# Patient Record
Sex: Male | Born: 1970 | Race: White | Hispanic: No | Marital: Single | Smoking: Former smoker
Health system: Southern US, Community
[De-identification: ages and names within clinical notes are randomized; demographics above are authoritative.]

## PROBLEM LIST (undated history)

## (undated) DIAGNOSIS — L570 Actinic keratosis: Secondary | ICD-10-CM

## (undated) DIAGNOSIS — C4490 Unspecified malignant neoplasm of skin, unspecified: Secondary | ICD-10-CM

## (undated) HISTORY — DX: Unspecified malignant neoplasm of skin, unspecified: C44.90

## (undated) HISTORY — DX: Actinic keratosis: L57.0

## (undated) HISTORY — PX: MOHS SURGERY: SHX181

---

## 1998-04-17 HISTORY — PX: CHOLECYSTECTOMY: SHX55

## 2007-04-18 HISTORY — PX: CHOLECYSTECTOMY: SHX55

## 2012-09-19 DIAGNOSIS — C4491 Basal cell carcinoma of skin, unspecified: Secondary | ICD-10-CM

## 2012-09-19 HISTORY — DX: Basal cell carcinoma of skin, unspecified: C44.91

## 2013-03-21 DIAGNOSIS — C4492 Squamous cell carcinoma of skin, unspecified: Secondary | ICD-10-CM

## 2013-03-21 HISTORY — DX: Squamous cell carcinoma of skin, unspecified: C44.92

## 2013-11-08 ENCOUNTER — Emergency Department: Payer: Self-pay | Admitting: Emergency Medicine

## 2013-12-10 ENCOUNTER — Ambulatory Visit (INDEPENDENT_AMBULATORY_CARE_PROVIDER_SITE_OTHER): Payer: BC Managed Care – PPO | Admitting: Family Medicine

## 2013-12-10 ENCOUNTER — Encounter: Payer: Self-pay | Admitting: Family Medicine

## 2013-12-10 ENCOUNTER — Other Ambulatory Visit: Payer: Self-pay | Admitting: Family Medicine

## 2013-12-10 VITALS — BP 120/82 | HR 65 | Temp 98.0°F | Resp 16 | Ht 72.0 in | Wt 212.0 lb

## 2013-12-10 DIAGNOSIS — S39012A Strain of muscle, fascia and tendon of lower back, initial encounter: Secondary | ICD-10-CM | POA: Insufficient documentation

## 2013-12-10 DIAGNOSIS — S335XXA Sprain of ligaments of lumbar spine, initial encounter: Secondary | ICD-10-CM

## 2013-12-10 DIAGNOSIS — N5201 Erectile dysfunction due to arterial insufficiency: Secondary | ICD-10-CM

## 2013-12-10 DIAGNOSIS — N529 Male erectile dysfunction, unspecified: Secondary | ICD-10-CM

## 2013-12-10 MED ORDER — SILDENAFIL CITRATE 100 MG PO TABS: 100.0000 mg | ORAL_TABLET | Freq: Every day | ORAL | Status: DC | PRN

## 2013-12-10 MED ORDER — CYCLOBENZAPRINE HCL 10 MG PO TABS: 10.0000 mg | ORAL_TABLET | Freq: Three times a day (TID) | ORAL | Status: DC | PRN

## 2013-12-10 NOTE — Assessment & Plan Note (Signed)
Myofascial LB strain, acute. Discussed need to do heat bid, alleve 440 bid x 12---with food, samples given. Flexeril 10mg  tid prn muscle spasm, #30, no RF.   Therapeutic expectations and side effect profile of medication discussed today.  Patient's questions answered. Reviewed handout of back stretching/strengthening exercises to do over the next few weeks.   Encouraged him to start regular CV exercise, attempt to lose some wt, as this will help in the long run with his back.

## 2013-12-10 NOTE — Progress Notes (Signed)
Office Note 12/10/2013  CC:  Chief Complaint  Patient presents with  . Establish Care  . Back Pain    x 1 week    HPI:  Wayne Sanford is a 43 y.o. White male who is here to establish care and discuss back pain. Patient's most recent primary MD: none Old records were not reviewed prior to or during today's visit.  Last Tdap was 2 wks ago when he got a fishhook in finger and went to ED.  Complains of constant lumbar back pain x 2 wks, occ sharp pain with certain movements.  No injury or overuse/lifting problem prior.  Intensity 3/10 constant, breakthrough pains 9/10.  NO radiation of pain, no paresthesias, no LE weakness. Massage helps but the muscles are tender.  Hasn't had this problem before. He doesn't get any exercise except walking as part of his job. No significant increase in weight lately associated with the onset of his pain.  No saddle anesthesia.  No loss of bowel or bladder control.  Has tried 3 advil on a few nights before bed but it doesn't help much.    Also c/o erectile dysfunction for the last 3-5 years, has never sought medical help.  Gets erection about 40% of "normal" firmness, can perform vaginal penetration but often cannot finish intercourse.  Says libido is normal.  Denies psychological problems that may be playing a role.  Has had same girlfriend for 2 yrs now.  Has never tried an ED med. He is a smoker, doesn't exercise, and is moderately overweight.    He says he some screening labs via his employer a few weeks ago and was told all was normal except his HDL was a "little low".  Past Medical History  Diagnosis Date  . Nonmelanoma skin cancer     Basal and squamous cell--multiple ([facility]--f/u q24mo)    Past Surgical History  Procedure Laterality Date  . Cholecystectomy  2000  . Mohs surgery      Family History  Problem Relation Age of Onset  . Adopted: Yes    History   Social History  . Marital Status: Single    Spouse  Name: N/A    Number of Children: N/A  . Years of Education: N/A   Occupational History  . Not on file.   Social History Main Topics  . Smoking status: Current Every Day Smoker -- 1.00 packs/day for 20 years    Types: Cigarettes  . Smokeless tobacco: Never Used  . Alcohol Use: No  . Drug Use: No  . Sexual Activity: Not on file   Other Topics Concern  . Not on file   Social History Narrative   Divorced, 3 daughters (10y, 12y, 21y).   Orig from [facility], relocated to [place] 2011.   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   Tob: 20 pack-yr hx --current as of 11/2013.   No alcohol  No drugs.   Enjoys golf, boating.    Outpatient Encounter Prescriptions as of 12/10/2013  Medication Sig  . cyclobenzaprine (FLEXERIL) 10 MG tablet Take 1 tablet (10 mg total) by mouth 3 (three) times daily as needed for muscle spasms.  . sildenafil (VIAGRA) 100 MG tablet Take 1 tablet (100 mg total) by mouth daily as needed for erectile dysfunction.  . [DISCONTINUED] sildenafil (VIAGRA) 100 MG tablet Take 1 tablet (100 mg total) by mouth daily as needed for erectile dysfunction.    No Known Allergies  ROS Review of Systems  Constitutional: Negative for fever and fatigue.  HENT: Negative for congestion and sore throat.   Eyes: Negative for visual disturbance.  Respiratory: Negative for cough.   Cardiovascular: Negative for chest pain.  Gastrointestinal: Negative for nausea and abdominal pain.  Genitourinary: Negative for dysuria.  Musculoskeletal: Negative for arthralgias, joint swelling and neck pain.  Skin: Negative for rash.  Neurological: Negative for weakness and headaches.  Hematological: Negative for adenopathy.  Psychiatric/Behavioral: Negative for dysphoric mood.    PE; Blood pressure 120/82, pulse 65, temperature 98 F (36.7 C), temperature source Temporal, resp. rate 16, height 6' (1.829 m), weight 212 lb (96.163 kg), SpO2 99.00%. BMI is 29 Gen: Alert, well appearing,  slightly overweight-appearing.  Patient is oriented to person, place, time, and situation. AFFECT: pleasant, lucid thought and speech. IZT:IWPY: no injection, icteris, swelling, or exudate.  EOMI, PERRLA. Mouth: lips without lesion/swelling.  Oral mucosa pink and moist. Oropharynx without erythema, exudate, or swelling.  CV: RRR, no m/r/g.   LUNGS: CTA bilat, nonlabored resps, good aeration in all lung fields. ABD: soft, NT/ND, no HSM or mass. EXT: no clubbing, cyanosis, or edema.  BACK: Rom fully intact, with mild pain in diffuse lumbar region with extension.  No tenderness of back, no deformity, no rash.  He has negative supine SLR bilat.  Strength 5/5 prox/dist in LE's bilat.  DTRs 1+ in patellar and achilles areas bilat.    Pertinent labs:  None today  ASSESSMENT AND PLAN:   New pt; no old records to obtain.  Low back strain Myofascial LB strain, acute. Discussed need to do heat bid, alleve 440 bid x 12---with food, samples given. Flexeril 10mg  tid prn muscle spasm, #30, no RF.   Therapeutic expectations and side effect profile of medication discussed today.  Patient's questions answered. Reviewed handout of back stretching/strengthening exercises to do over the next few weeks.   Encouraged him to start regular CV exercise, attempt to lose some wt, as this will help in the long run with his back.    Erectile dysfunction Chronic. Emphasized need to quit smoking, exercise, lose some weight. Opted for trial of viagra 50-100 mg qd prn, #9.  Prescriptions savings coupon given to pt today.  Therapeutic expectations and side effect profile of medication discussed today.  Patient's questions answered.   An After Visit Summary was printed and given to the patient.  Return if symptoms worsen or fail to improve.

## 2013-12-10 NOTE — Patient Instructions (Signed)
Take 2 alleve tabs twice a day for 12 days (take with food).  Back Exercises These exercises may help you when beginning to rehabilitate your injury. Your symptoms may resolve with or without further involvement from your physician, physical therapist or athletic trainer. While completing these exercises, remember:   Restoring tissue flexibility helps normal motion to return to the joints. This allows healthier, less painful movement and activity.  An effective stretch should be held for at least 30 seconds.  A stretch should never be painful. You should only feel a gentle lengthening or release in the stretched tissue. STRETCH - Extension, Prone on Elbows   Lie on your stomach on the floor, a bed will be too soft. Place your palms about shoulder width apart and at the height of your head.  Place your elbows under your shoulders. If this is too painful, stack pillows under your chest.  Allow your body to relax so that your hips drop lower and make contact more completely with the floor.  Hold this position for __________ seconds.  Slowly return to lying flat on the floor. Repeat __________ times. Complete this exercise __________ times per day.  RANGE OF MOTION - Extension, Prone Press Ups   Lie on your stomach on the floor, a bed will be too soft. Place your palms about shoulder width apart and at the height of your head.  Keeping your back as relaxed as possible, slowly straighten your elbows while keeping your hips on the floor. You may adjust the placement of your hands to maximize your comfort. As you gain motion, your hands will come more underneath your shoulders.  Hold this position __________ seconds.  Slowly return to lying flat on the floor. Repeat __________ times. Complete this exercise __________ times per day.  RANGE OF MOTION- Quadruped, Neutral Spine   Assume a hands and knees position on a firm surface. Keep your hands under your shoulders and your knees under your  hips. You may place padding under your knees for comfort.  Drop your head and point your tail bone toward the ground below you. This will round out your low back like an angry cat. Hold this position for __________ seconds.  Slowly lift your head and release your tail bone so that your back sags into a large arch, like an old horse.  Hold this position for __________ seconds.  Repeat this until you feel limber in your low back.  Now, find your "sweet spot." This will be the most comfortable position somewhere between the two previous positions. This is your neutral spine. Once you have found this position, tense your stomach muscles to support your low back.  Hold this position for __________ seconds. Repeat __________ times. Complete this exercise __________ times per day.  STRETCH - Flexion, Single Knee to Chest   Lie on a firm bed or floor with both legs extended in front of you.  Keeping one leg in contact with the floor, bring your opposite knee to your chest. Hold your leg in place by either grabbing behind your thigh or at your knee.  Pull until you feel a gentle stretch in your low back. Hold __________ seconds.  Slowly release your grasp and repeat the exercise with the opposite side. Repeat __________ times. Complete this exercise __________ times per day.  STRETCH - Hamstrings, Standing  Stand or sit and extend your right / left leg, placing your foot on a chair or foot stool  Keeping a slight arch in  your low back and your hips straight forward.  Lead with your chest and lean forward at the waist until you feel a gentle stretch in the back of your right / left knee or thigh. (When done correctly, this exercise requires leaning only a small distance.)  Hold this position for __________ seconds. Repeat __________ times. Complete this stretch __________ times per day. STRENGTHENING - Deep Abdominals, Pelvic Tilt   Lie on a firm bed or floor. Keeping your legs in front of  you, bend your knees so they are both pointed toward the ceiling and your feet are flat on the floor.  Tense your lower abdominal muscles to press your low back into the floor. This motion will rotate your pelvis so that your tail bone is scooping upwards rather than pointing at your feet or into the floor.  With a gentle tension and even breathing, hold this position for __________ seconds. Repeat __________ times. Complete this exercise __________ times per day.  STRENGTHENING - Abdominals, Crunches   Lie on a firm bed or floor. Keeping your legs in front of you, bend your knees so they are both pointed toward the ceiling and your feet are flat on the floor. Cross your arms over your chest.  Slightly tip your chin down without bending your neck.  Tense your abdominals and slowly lift your trunk high enough to just clear your shoulder blades. Lifting higher can put excessive stress on the low back and does not further strengthen your abdominal muscles.  Control your return to the starting position. Repeat __________ times. Complete this exercise __________ times per day.  STRENGTHENING - Quadruped, Opposite UE/LE Lift   Assume a hands and knees position on a firm surface. Keep your hands under your shoulders and your knees under your hips. You may place padding under your knees for comfort.  Find your neutral spine and gently tense your abdominal muscles so that you can maintain this position. Your shoulders and hips should form a rectangle that is parallel with the floor and is not twisted.  Keeping your trunk steady, lift your right hand no higher than your shoulder and then your left leg no higher than your hip. Make sure you are not holding your breath. Hold this position __________ seconds.  Continuing to keep your abdominal muscles tense and your back steady, slowly return to your starting position. Repeat with the opposite arm and leg. Repeat __________ times. Complete this exercise  __________ times per day. Document Released: 04/21/2005 Document Revised: 06/26/2011 Document Reviewed: 07/16/2008 [facility] Patient Information 2015 [place], [place]. This information is not intended to replace advice given to you by your health care provider. Make sure you discuss any questions you have with your health care provider.

## 2013-12-10 NOTE — Assessment & Plan Note (Signed)
Chronic. Emphasized need to quit smoking, exercise, lose some weight. Opted for trial of viagra 50-100 mg qd prn, #9.  Prescriptions savings coupon given to pt today.  Therapeutic expectations and side effect profile of medication discussed today.  Patient's questions answered.

## 2014-01-05 ENCOUNTER — Ambulatory Visit: Payer: BC Managed Care – PPO | Admitting: Family Medicine

## 2014-01-05 ENCOUNTER — Emergency Department (HOSPITAL_COMMUNITY): Payer: BC Managed Care – PPO

## 2014-01-05 ENCOUNTER — Emergency Department (HOSPITAL_COMMUNITY)
Admission: EM | Admit: 2014-01-05 | Discharge: 2014-01-05 | Disposition: A | Discharge status: Home / Self Care | Payer: BC Managed Care – PPO | Attending: Emergency Medicine | Admitting: Emergency Medicine

## 2014-01-05 ENCOUNTER — Encounter (HOSPITAL_COMMUNITY): Payer: Self-pay | Admitting: Emergency Medicine

## 2014-01-05 DIAGNOSIS — R42 Dizziness and giddiness: Secondary | ICD-10-CM | POA: Diagnosis not present

## 2014-01-05 DIAGNOSIS — R29810 Facial weakness: Secondary | ICD-10-CM | POA: Diagnosis present

## 2014-01-05 DIAGNOSIS — Z85820 Personal history of malignant melanoma of skin: Secondary | ICD-10-CM | POA: Diagnosis not present

## 2014-01-05 DIAGNOSIS — Z79899 Other long term (current) drug therapy: Secondary | ICD-10-CM | POA: Insufficient documentation

## 2014-01-05 DIAGNOSIS — F17200 Nicotine dependence, unspecified, uncomplicated: Secondary | ICD-10-CM | POA: Insufficient documentation

## 2014-01-05 DIAGNOSIS — R519 Headache, unspecified: Secondary | ICD-10-CM

## 2014-01-05 DIAGNOSIS — R51 Headache: Secondary | ICD-10-CM | POA: Insufficient documentation

## 2014-01-05 LAB — I-STAT CHEM 8, ED
BUN: 14 mg/dL (ref 6–23)
Calcium, Ion: 1.21 mmol/L (ref 1.12–1.23)
Chloride: 106 mEq/L (ref 96–112)
Creatinine, Ser: 1.3 mg/dL (ref 0.50–1.35)
GLUCOSE: 110 mg/dL — AB (ref 70–99)
HEMATOCRIT: 47 % (ref 39.0–52.0)
HEMOGLOBIN: 16 g/dL (ref 13.0–17.0)
Potassium: 3.8 mEq/L (ref 3.7–5.3)
Sodium: 140 mEq/L (ref 137–147)
TCO2: 25 mmol/L (ref 0–100)

## 2014-01-05 NOTE — ED Notes (Signed)
Pt reports dizziness x 1 month with difficulty forming words. Last Friday pt visitor reported to him that the left side of his face was drooping. Today visitor reported the same. LSN last night. Pt c/o right sided headache. Speech clear, tongue midline, equal grips.

## 2014-01-05 NOTE — ED Provider Notes (Signed)
CSN: [id number]     Arrival date & time 01/05/14  3267 History   First MD Initiated Contact with Patient 01/05/14 1006     Chief Complaint  Patient presents with  . Facial Droop  . Dizziness     (Consider location/radiation/quality/duration/timing/severity/associated sxs/prior Treatment) HPI Comments: Wayne Sanford 43 y.o. With a history of smoking and basal cell carcinoma presents to the ED for concern of transient facial droop. This occurred about 10 days ago and again today. His wife reports the left lower half of his face appeared to droop. He does have frequent headaches as well. No headache at this time. No DM, HTN, CVA. No family history as the patient is adopted. He has a chronic history of headaches which have increased in frequency lately due to stress at work reportedly. He has a new boss with "new demands" causing a lot of stress. He has also experienced some insomnia. Denies dysphagia, numbness, tingling, weakness, visual changes, difficulty with his gait. He also reports some intermittent difficulty finding words over the past months as well. Denies fever, chills, sweats.  Patient is a 43 y.o. male presenting with neurologic complaint. The history is provided by the patient and the spouse.  Neurologic Problem This is a new problem. Episode onset: 9 days ago. The problem occurs rarely (occurred once ten days ago (lasted for about an hour), occurred again today (lasted for about 30 minutes)). Associated symptoms include headaches. Pertinent negatives include no abdominal pain, anorexia, arthralgias, change in bowel habit, chest pain, chills, congestion, coughing, diaphoresis, fatigue, fever, joint swelling, myalgias, nausea, neck pain, numbness, rash, sore throat, swollen glands, urinary symptoms, vertigo, visual change, vomiting or weakness. Nothing aggravates the symptoms. He has tried nothing for the symptoms.    Past Medical History  Diagnosis Date  . Nonmelanoma skin cancer      Basal and squamous cell--multiple ([facility]--f/u q41mo)   Past Surgical History  Procedure Laterality Date  . Cholecystectomy  2000  . Mohs surgery     Family History  Problem Relation Age of Onset  . Adopted: Yes   History  Substance Use Topics  . Smoking status: Current Every Day Smoker -- 1.00 packs/day for 20 years    Types: Cigarettes  . Smokeless tobacco: Never Used  . Alcohol Use: No    Review of Systems  Constitutional: Negative for fever, chills, diaphoresis and fatigue.  HENT: Negative for congestion and sore throat.   Respiratory: Negative for cough.   Cardiovascular: Negative for chest pain.  Gastrointestinal: Negative for nausea, vomiting, abdominal pain, anorexia and change in bowel habit.  Musculoskeletal: Negative for arthralgias, joint swelling, myalgias and neck pain.  Skin: Negative for rash.  Neurological: Positive for headaches. Negative for vertigo, weakness and numbness.  All other systems reviewed and are negative.     Allergies  Review of patient's allergies indicates no known allergies.  Home Medications   Prior to Admission medications   Medication Sig Start Date End Date Taking? Authorizing Provider  cyclobenzaprine (FLEXERIL) 10 MG tablet Take 1 tablet (10 mg total) by mouth 3 (three) times daily as needed for muscle spasms. 12/10/13  Yes Tammi Sou, MD  sildenafil (VIAGRA) 100 MG tablet Take 1 tablet (100 mg total) by mouth daily as needed for erectile dysfunction. 12/10/13  Yes Tammi Sou, MD   BP 108/63  Pulse 52  Temp(Src) 97.3 F (36.3 C) (Oral)  Resp 17  Ht 6' (1.829 m)  Wt 215 lb (  97.523 kg)  BMI 29.15 kg/m2  SpO2 98% Physical Exam  Nursing note and vitals reviewed. Constitutional: He is oriented to person, place, and time. He appears well-developed and well-nourished. No distress.  HENT:  Head: Normocephalic and atraumatic.  Eyes: Conjunctivae and EOM are normal. Right eye exhibits no discharge.  Left eye exhibits no discharge.  Neck: Normal range of motion. Neck supple. No tracheal deviation present.  Cardiovascular: Normal rate, regular rhythm and normal heart sounds.  Exam reveals no friction rub.   No murmur heard. Pulmonary/Chest: Effort normal and breath sounds normal. No stridor. No respiratory distress. He has no wheezes. He has no rales. He exhibits no tenderness.  Abdominal: Soft. He exhibits no distension. There is no tenderness. There is no rebound and no guarding.  Neurological: He is alert and oriented to person, place, and time. No cranial nerve deficit.  Strength in flexion and extension at the shoulders, elbows, wrists, hips, knee, and ankle 5/5 bilaterally EHL 5/5 bilaterally Grip strength 5/5 bilaterally Finger to nose, heel toe shin and rapid alternating movements intact bilaterally. No pronator drift bilaterally. Sensation over the dorsum of the hand over the 1st, second and 5th metacarpal, and the lateral forearm and lateral shoulder intact bilaterally.  Sensation over the medial and lateral malleolus, and over the 1st metatarsal intact bilaterally.   Skin: Skin is warm.  Psychiatric: He has a normal mood and affect.    ED Course  Procedures (including critical care time) Labs Review Labs Reviewed  I-STAT CHEM 8, ED - Abnormal; Notable for the following:    Glucose, Bld 110 (*)    All other components within normal limits  CBG MONITORING, ED    Imaging Review Ct Head Wo Contrast  01/05/2014   CLINICAL DATA:  Dizziness.  Facial droop.  EXAM: CT HEAD WITHOUT CONTRAST  TECHNIQUE: Contiguous axial images were obtained from the base of the skull through the vertex without intravenous contrast.  COMPARISON:  None.  FINDINGS: No intra-axial or extra-axial pathologic fluid or blood collections identified. No mass lesion. No hydrocephalus. No hemorrhage. No acute bony abnormality.  IMPRESSION: No acute abnormality.   Electronically Signed   By: Marcello Moores  Register   On:  01/05/2014 11:47     EKG Interpretation   Date/Time:  Monday January 05 2014 09:30:20 EDT Ventricular Rate:  69 PR Interval:  130 QRS Duration: 96 QT Interval:  398 QTC Calculation: 426 R Axis:   87 Text Interpretation:  Normal sinus rhythm Incomplete right bundle branch  block Rightward axis Nonspecific T wave abnormality No previous tracing  Confirmed by Ashok Cordia  MD, Lennette Bihari ([id number]) on 01/05/2014 10:35:48 AM      MDM   Final diagnoses:  Facial droop  Acute nonintractable headache, unspecified headache type    Pt presents with transient facial droop and chronic headaches. AFVSS. Only risk factor for TIA include smoking. AFVSS here. VS wnls. CN intact. Motor function, sensory function, and cerebellar function intact. I have a low suspicion this is related to an acute intracranial event. CT head NAICA. ABCD2 score is 1 (age <60, BP <140/90, No unilateral weakness, no speech, and symptoms <1 hour) disturbance - low risk for stroke. Safe to dc home and follow up with his primary care doctor for further work up of possible TIA. Also, gave information to establish care and be evaluated by a neurologist for his chronic headaches. He and his wife were in agreement with the treatment plan. Gave strong return precautions including worsening symptoms or  any other alarming or concerning symptoms or issues. Strong return precautions given for worsening symptoms or any other alarming or concerning symptoms or issues. The patient was in agreement with the treatment plan and I answered all of their questions. The patient was stable for dc. At dc, the patient ambulated without difficulty, was moving all four extremities, symptoms improved, NAD. and AOx4 Care discussed with my attending, Dr. Ashok Cordia. If performed and available, imaging studies and labs reviewed.     Kelby Aline, MD 01/05/14 [phone]

## 2014-01-05 NOTE — ED Notes (Signed)
POC CBG resulted 91

## 2014-01-06 ENCOUNTER — Ambulatory Visit (INDEPENDENT_AMBULATORY_CARE_PROVIDER_SITE_OTHER): Payer: BC Managed Care – PPO | Admitting: Family Medicine

## 2014-01-06 ENCOUNTER — Encounter: Payer: Self-pay | Admitting: Family Medicine

## 2014-01-06 VITALS — BP 118/75 | HR 65 | Temp 98.2°F | Resp 16 | Ht 72.0 in | Wt 210.0 lb

## 2014-01-06 DIAGNOSIS — F329 Major depressive disorder, single episode, unspecified: Secondary | ICD-10-CM | POA: Insufficient documentation

## 2014-01-06 DIAGNOSIS — F419 Anxiety disorder, unspecified: Secondary | ICD-10-CM

## 2014-01-06 DIAGNOSIS — F32A Depression, unspecified: Secondary | ICD-10-CM

## 2014-01-06 DIAGNOSIS — R51 Headache: Secondary | ICD-10-CM

## 2014-01-06 DIAGNOSIS — G8929 Other chronic pain: Secondary | ICD-10-CM

## 2014-01-06 DIAGNOSIS — F341 Dysthymic disorder: Secondary | ICD-10-CM

## 2014-01-06 DIAGNOSIS — R29818 Other symptoms and signs involving the nervous system: Secondary | ICD-10-CM

## 2014-01-06 NOTE — Assessment & Plan Note (Signed)
This could explain all of his symptoms except the facial droop. I will hold off on any meds at this time in order to avoid causing any confusion regarding possible med side effects/symptoms, etc. Will pick back up with this after he sees the neurologist.

## 2014-01-06 NOTE — Progress Notes (Signed)
OFFICE VISIT  01/06/2014   CC:  Chief Complaint  Patient presents with  . Hospitalization Follow-up   HPI:    Patient is a 43 y.o. Caucasian male who presents for ED f/u-was seen yesterday for hx of facial droop that occurred on 2 occasions.  I reviewed the ED record completely today, exam was normal, lytes/cr normal, CT head normal, EKG normal, VS normal.  He was reassured and told to f/u here.    Pt reports two distinct occasions of noticing his left side of face around the mouth being droopy compared to the right, one time was 10d ago and the other was yesterday, each time it lasted only 20-30 min and was not associated with any other complaint. He does say he has had a chronic mild headache for about the last 6 wks, involves entire head and is not throbbing or anything that prompts him to take any abortive med.  He denies photo/phonophobia, denies nausea.  He has no hx of migraine HA's. No unilateral periorbital HA's or tearing noted.  No hearing complaints.  He has some intermittent, brief periods in which he feels like his vision is blurry, like he has to stand further back from what he is looking at for it to be in focus.  Says this is new for him. The facial droop episodes are not associated with worsening of his HA and have not coincided with his vision blurriness.  He admits to being under a lot more stress at work, has a boss whom he feels like he has trouble understanding/communicating with.  He has been finding it hard to get the right words when talking lately, sometimes struggles to communicate at work despite having the same job for 10 yrs.  Admits to feeling anxious, depressed, having poor concentration, some insomnia lately, decreased appetite.  Denies SI or HI.    Denies slurred speech, swallowing problems, paresthesias/numbness, or focal weakness of any extremities.   No new meds recently.  No meds at all, in fact.     Past Medical History  Diagnosis Date  . Nonmelanoma  skin cancer     Basal and squamous cell--multiple ( [facility]--f/u q62mo)    Past Surgical History  Procedure Laterality Date  . Cholecystectomy  2000  . Mohs surgery     MEDS: none  No Known Allergies  ROS As per HPI  PE: Blood pressure 118/75, pulse 65, temperature 98.2 F (36.8 C), temperature source Temporal, resp. rate 16, height 6' (1.829 m), weight 210 lb (95.255 kg), SpO2 98.00%. Gen: Alert, well appearing.  Patient is oriented to person, place, time, and situation. AFFECT: pleasant, lucid thought and speech.  Makes decent eye contact. EGB:TDVV: no injection, icteris, swelling, or exudate.  EOMI, PERRLA. Mouth: lips without lesion/swelling.  No facial asymmetry.  Oral mucosa pink and moist. Oropharynx without erythema, exudate, or swelling.  Neck: supple/nontender.  No LAD, mass, or TM.  Carotid pulses 2+ bilaterally, without bruits. CV: RRR, no m/r/g.   LUNGS: CTA bilat, nonlabored resps, good aeration in all lung fields. Neuro: CN 2-12 intact bilaterally, strength 5/5 in proximal and distal upper extremities and lower extremities bilaterally.  No tremor.  No disdiadochokinesis.  No ataxia.  Upper extremity and lower extremity DTRs symmetric.  No pronator drift.  LABS:  None today Recent:    Chemistry      Component Value Date/Time   NA 140 01/05/2014 1110   K 3.8 01/05/2014 1110   CL 106 01/05/2014 1110  BUN 14 01/05/2014 1110   CREATININE 1.30 01/05/2014 1110   No results found for this basename: CALCIUM,  ALKPHOS,  AST,  ALT,  BILITOT      IMPRESSION AND PLAN:  Transient neurological symptoms Question of TIA, but I also wonder about basilar migraine. Head CT normal in ED yesterday. At this time, I recommend no meds.  I recommend he get further evaluation by neurologist given these sx's are unexplained and seem to be associated with chronic HA's lately as well as intermittent blurry vision. I will obtain carotid dopplers.  Anxiety and  depression This could explain all of his symptoms except the facial droop. I will hold off on any meds at this time in order to avoid causing any confusion regarding possible med side effects/symptoms, etc. Will pick back up with this after he sees the neurologist.   An After Visit Summary was printed and given to the patient.  FOLLOW UP: Return in about 3 months (around 04/07/2014) for f/u anx/dep.

## 2014-01-06 NOTE — Progress Notes (Signed)
Pre visit review using our clinic review tool, if applicable. No additional management support is needed unless otherwise documented below in the visit note. 

## 2014-01-06 NOTE — ED Provider Notes (Signed)
I saw and evaluated the patient, reviewed the resident's note and I agree with the findings and plan.   EKG Interpretation   Date/Time:  Monday January 05 2014 09:30:20 EDT Ventricular Rate:  69 PR Interval:  130 QRS Duration: 96 QT Interval:  398 QTC Calculation: 426 R Axis:   87 Text Interpretation:  Normal sinus rhythm Incomplete right bundle branch  block Rightward axis Nonspecific T wave abnormality No previous tracing  Confirmed by Maricsa Sammons  MD, Lennette Bihari ([id number]) on 01/05/2014 10:35:48 AM      Pt c/o intermittent headaches, gradual onset, at times associated w stress, and at times associated w trouble findings words. No unilateral numbness/weakness. No loss of normal functional ability.   No current headache or any numbness/weankess. Speech is clear/fluent, no asphasia. Labs. Ct.   Mirna Mires, MD 01/06/14 [phone]

## 2014-01-06 NOTE — Assessment & Plan Note (Signed)
Question of TIA, but I also wonder about basilar migraine. Head CT normal in ED yesterday. At this time, I recommend no meds.  I recommend he get further evaluation by neurologist given these sx's are unexplained and seem to be associated with chronic HA's lately as well as intermittent blurry vision. I will obtain carotid dopplers.

## 2014-01-19 ENCOUNTER — Ambulatory Visit: Payer: Self-pay | Admitting: Neurology

## 2019-01-24 ENCOUNTER — Other Ambulatory Visit: Payer: Self-pay

## 2019-01-24 DIAGNOSIS — Z20822 Contact with and (suspected) exposure to covid-19: Secondary | ICD-10-CM

## 2019-01-26 LAB — NOVEL CORONAVIRUS, NAA: SARS-CoV-2, NAA: DETECTED — AB

## 2019-04-21 DIAGNOSIS — C4491 Basal cell carcinoma of skin, unspecified: Secondary | ICD-10-CM | POA: Insufficient documentation

## 2019-07-29 ENCOUNTER — Ambulatory Visit (INDEPENDENT_AMBULATORY_CARE_PROVIDER_SITE_OTHER): Payer: No Typology Code available for payment source | Admitting: Dermatology

## 2019-07-29 ENCOUNTER — Other Ambulatory Visit: Payer: Self-pay

## 2019-07-29 DIAGNOSIS — L570 Actinic keratosis: Secondary | ICD-10-CM

## 2019-07-29 DIAGNOSIS — C4441 Basal cell carcinoma of skin of scalp and neck: Secondary | ICD-10-CM

## 2019-07-29 DIAGNOSIS — L814 Other melanin hyperpigmentation: Secondary | ICD-10-CM | POA: Diagnosis not present

## 2019-07-29 DIAGNOSIS — D485 Neoplasm of uncertain behavior of skin: Secondary | ICD-10-CM

## 2019-07-29 DIAGNOSIS — L821 Other seborrheic keratosis: Secondary | ICD-10-CM

## 2019-07-29 DIAGNOSIS — Z85828 Personal history of other malignant neoplasm of skin: Secondary | ICD-10-CM | POA: Diagnosis not present

## 2019-07-29 DIAGNOSIS — L905 Scar conditions and fibrosis of skin: Secondary | ICD-10-CM

## 2019-07-29 DIAGNOSIS — L578 Other skin changes due to chronic exposure to nonionizing radiation: Secondary | ICD-10-CM | POA: Diagnosis not present

## 2019-07-29 DIAGNOSIS — C44519 Basal cell carcinoma of skin of other part of trunk: Secondary | ICD-10-CM

## 2019-07-29 NOTE — Patient Instructions (Addendum)
Wound Care Instructions  1. Cleanse wound gently with soap and water once a day then pat dry with clean gauze. Apply a thing coat of Petrolatum (petroleum jelly, "Vaseline") over the wound (unless you have an allergy to this). We recommend that you use a new, sterile tube of Vaseline. Do not pick or remove scabs. Do not remove the yellow or white "healing tissue" from the base of the wound.  2. Cover the wound with fresh, clean, nonstick gauze and secure with paper tape. You may use Band-Aids in place of gauze and tape if the would is small enough, but would recommend trimming much of the tape off as there is often too much. Sometimes Band-Aids can irritate the skin.  3. You should call the office for your biopsy report after 1 week if you have not already been contacted.  4. If you experience any problems, such as abnormal amounts of bleeding, swelling, significant bruising, significant pain, or evidence of infection, please call the office immediately.  5. FOR ADULT SURGERY PATIENTS: If you need something for pain relief you may take 1 extra strength Tylenol (acetaminophen) AND 2 Ibuprofen (200mg each) together every 4 hours as needed for pain. (do not take these if you are allergic to them or if you have a reason you should not take them.) Typically, you may only need pain medication for 1 to 3 days.    Cryotherapy Aftercare  . Wash gently with soap and water everyday.   . Apply Vaseline and Band-Aid daily until healed.  Recommend daily broad spectrum sunscreen SPF 30+ to sun-exposed areas, reapply every 2 hours as needed. Call for new or changing lesions.  

## 2019-07-29 NOTE — Progress Notes (Signed)
Follow-Up Visit   Subjective  Wayne Sanford is a 49 y.o. male who presents for the following: Follow-up (AK's, ISK's, BCC's 3 month follow up. AK's and ISK's treated with LN2 at last visit. Resolved per pt. Nothing new or changing pt is aware of. ).   The following portions of the chart were reviewed this encounter and updated as appropriate:     Review of Systems: No other skin or systemic complaints.  Objective  Well appearing patient in no apparent distress; mood and affect are within normal limits.  A focused examination was performed including face, trunk. Relevant physical exam findings are noted in the Assessment and Plan.  Objective  Left antihelix x 1, L temple x 2, R temple x 3, crown x 5 (10): Erythematous thin papules/macules with gritty scale.   Objective  Right lower chest, R lower flank: Well healed scars with no evidence of recurrence.  Thickened on chest.  Objective  Spinal mid back: 1.3cm pink white lobulated papule with adjacent white scar     Objective  Left lateral upper chest: 1.5 x 0.7cm pink scaly patch     Objective  Posterior lower neck: 19mm pearly papule     Objective  trunk: Multiple dyspigmented smooth macule or patch, some thickened  Assessment & Plan  AK (actinic keratosis) (10) Left antihelix x 1, L temple x 2, R temple x 3, crown x 5  Destruction of lesion - Left antihelix x 1, L temple x 2, R temple x 3, crown x 5 Complexity: simple   Destruction method: cryotherapy   Informed consent: discussed and consent obtained   Lesion destroyed using liquid nitrogen: Yes   Region frozen until ice ball extended beyond lesion: Yes   Outcome: patient tolerated procedure well with no complications   Post-procedure details: wound care instructions given    Basal cell carcinoma (BCC) of skin of neck  Basal cell carcinoma (BCC) of skin of other part of torso  History of basal cell carcinoma (BCC) Right lower chest, R lower  flank  EDC last visit to both areas. No evidence of recurrence, call clinic for new or changing lesions.   Neoplasm of uncertain behavior of skin (3) Spinal mid back  Skin / nail biopsy Type of biopsy: tangential   Informed consent: discussed and consent obtained   Patient was prepped and draped in usual sterile fashion: Area prepped with alcohol. Anesthesia: the lesion was anesthetized in a standard fashion   Anesthetic:  1% lidocaine w/ epinephrine 1-100,000 buffered w/ 8.4% NaHCO3 Instrument used: flexible razor blade   Hemostasis achieved with: pressure, aluminum chloride and electrodesiccation   Outcome: patient tolerated procedure well   Post-procedure details: wound care instructions given   Post-procedure details comment:  Ointment and small bandage applied  Destruction of lesion Complexity: simple   Destruction method: electrodesiccation and curettage   Informed consent: discussed and consent obtained   Timeout:  patient name, date of birth, surgical site, and procedure verified Curettage performed in three different directions: Yes   Electrodesiccation performed over the curetted area: Yes   Lesion length (cm):  1.3 Lesion width (cm):  1.3 Margin per side (cm):  0.2 Final wound size (cm):  1.7 Hemostasis achieved with:  pressure, aluminum chloride and electrodesiccation Outcome: patient tolerated procedure well with no complications   Post-procedure details: wound care instructions given    Specimen 1 - Surgical pathology Differential Diagnosis: Hypertrophic Scar r/o BCC Check Margins: No 1.3cm pink white papule with  adjacent white scar  Left lateral upper chest  Skin / nail biopsy Type of biopsy: tangential   Informed consent: discussed and consent obtained   Patient was prepped and draped in usual sterile fashion: Area prepped with alcohol. Anesthesia: the lesion was anesthetized in a standard fashion   Anesthetic:  1% lidocaine w/ epinephrine 1-100,000  buffered w/ 8.4% NaHCO3 Instrument used: flexible razor blade   Hemostasis achieved with: pressure, aluminum chloride and electrodesiccation   Outcome: patient tolerated procedure well   Post-procedure details: wound care instructions given   Post-procedure details comment:  Ointment and small bandage applied  Destruction of lesion Complexity: simple   Destruction method: electrodesiccation and curettage   Informed consent: discussed and consent obtained   Timeout:  patient name, date of birth, surgical site, and procedure verified Curettage performed in three different directions: Yes   Electrodesiccation performed over the curetted area: Yes   Lesion length (cm):  1.5 Lesion width (cm):  0.7 Margin per side (cm):  0.1 Final wound size (cm):  1.7 Hemostasis achieved with:  pressure, aluminum chloride and electrodesiccation Outcome: patient tolerated procedure well with no complications   Post-procedure details: wound care instructions given    Specimen 2 - Surgical pathology Differential Diagnosis: Superficial BCC vs AK Check Margins: No 1.5 x 0.7cm pink scaly patch  Posterior lower neck  Skin / nail biopsy Type of biopsy: tangential   Informed consent: discussed and consent obtained   Patient was prepped and draped in usual sterile fashion: Area prepped with alcohol. Anesthesia: the lesion was anesthetized in a standard fashion   Anesthetic:  1% lidocaine w/ epinephrine 1-100,000 buffered w/ 8.4% NaHCO3 Instrument used: flexible razor blade   Hemostasis achieved with: pressure, aluminum chloride and electrodesiccation   Outcome: patient tolerated procedure well   Post-procedure details: wound care instructions given   Post-procedure details comment:  Ointment and small bandage applied  Destruction of lesion Complexity: simple   Destruction method: electrodesiccation and curettage   Informed consent: discussed and consent obtained   Timeout:  patient name, date of birth,  surgical site, and procedure verified Curettage performed in three different directions: Yes   Electrodesiccation performed over the curetted area: Yes   Lesion length (cm):  0.8 Lesion width (cm):  0.8 Margin per side (cm):  0.2 Final wound size (cm):  1.2 Hemostasis achieved with:  pressure, aluminum chloride and electrodesiccation Outcome: patient tolerated procedure well with no complications   Post-procedure details: wound care instructions given    Specimen 3 - Surgical pathology Differential Diagnosis: r/o BCC Check Margins: No 60mm pearly papule  Scar trunk  Benign, observe.   Actinic Damage - diffuse scaly erythematous macules with underlying dyspigmentation - Recommend daily broad spectrum sunscreen SPF 30+ to sun-exposed areas, reapply every 2 hours as needed.  - Call for new or changing lesions.  Seborrheic Keratoses - Stuck-on, waxy, tan-Muckey papules and plaques  - Discussed benign etiology and prognosis. - Observe - Call for any changes  Lentigines - Scattered tan macules - Discussed due to sun exposure - Benign, observe - Call for any changes   Return in about 3 months (around 10/28/2019) for AK's, recheck trunk.   Graciella Belton, RMA, am acting as scribe for Brendolyn Patty, MD .

## 2019-08-01 ENCOUNTER — Telehealth: Payer: Self-pay

## 2019-08-01 NOTE — Telephone Encounter (Signed)
-----   Message from Brendolyn Patty, MD sent at 08/01/2019 10:22 AM EDT ----- 1. Skin , spinal mid back BASAL CELL CARCINOMA, NODULAR AND INFILTRATIVE PATTERNS, BASE INVOLVED 2. Skin , left lateral upper chest BASAL CELL CARCINOMA, SUPERFICIAL AND NODULAR PATTERNS, BASE INVOLVED 3. Skin , posterior lower neck BASAL CELL CARCINOMA, SUPERFICIAL AND NODULAR PATTERNS, CLOSE TO MARGIN  All three areas treated with EDC at time of biopsy.  Will recheck areas on f/up

## 2019-08-01 NOTE — Telephone Encounter (Signed)
Lft pt msg to call for bx results/sh °

## 2019-08-06 ENCOUNTER — Telehealth: Payer: Self-pay

## 2019-08-06 NOTE — Telephone Encounter (Signed)
-----   Message from Brendolyn Patty, MD sent at 08/01/2019 10:22 AM EDT ----- 1. Skin , spinal mid back BASAL CELL CARCINOMA, NODULAR AND INFILTRATIVE PATTERNS, BASE INVOLVED 2. Skin , left lateral upper chest BASAL CELL CARCINOMA, SUPERFICIAL AND NODULAR PATTERNS, BASE INVOLVED 3. Skin , posterior lower neck BASAL CELL CARCINOMA, SUPERFICIAL AND NODULAR PATTERNS, CLOSE TO MARGIN  All three areas treated with EDC at time of biopsy.  Will recheck areas on f/up

## 2019-08-06 NOTE — Telephone Encounter (Signed)
Advised pt of bx results/sh ?

## 2019-11-03 ENCOUNTER — Ambulatory Visit: Payer: No Typology Code available for payment source | Admitting: Dermatology

## 2021-02-03 ENCOUNTER — Other Ambulatory Visit: Payer: Self-pay

## 2021-02-03 ENCOUNTER — Ambulatory Visit (INDEPENDENT_AMBULATORY_CARE_PROVIDER_SITE_OTHER): Payer: 59 | Admitting: Podiatry

## 2021-02-03 ENCOUNTER — Ambulatory Visit: Payer: 59

## 2021-02-03 ENCOUNTER — Ambulatory Visit (INDEPENDENT_AMBULATORY_CARE_PROVIDER_SITE_OTHER): Payer: 59

## 2021-02-03 DIAGNOSIS — M898X9 Other specified disorders of bone, unspecified site: Secondary | ICD-10-CM

## 2021-02-03 DIAGNOSIS — Z01818 Encounter for other preprocedural examination: Secondary | ICD-10-CM

## 2021-02-03 DIAGNOSIS — L84 Corns and callosities: Secondary | ICD-10-CM

## 2021-02-03 NOTE — Progress Notes (Signed)
Subjective:  Patient ID: Wayne Sanford, male    DOB: Sep 13, 1970,  MRN: [id number]  Chief Complaint  Patient presents with   Toe Pain    Right foot place between 4th and 5th toe causing discomfort     50 y.o. male presents with the above complaint.  Patient presents with complaint of right fourth and fifth digit heloma molle.  Patient states is very painful to touch has progressed to gotten worse.  He is tried all the conservative treatment options none of which has helped.  He is tried shoe gear modification putting spacers in between the toes he has tried some multiple corn pads none of which has helped.  He would like to discuss surgical options and have it taken out.  He has not seen anyone else prior to seeing me.  He denies any other acute issues.   Review of Systems: Negative except as noted in the HPI. Denies N/V/F/Ch.  Past Medical History:  Diagnosis Date   Basal cell carcinoma 09/19/2012   mid chest, nodular pattern   Basal cell carcinoma 03/21/2013   right base of neck, nodular pattern   Basal cell carcinoma 04/21/2019   right lower chest, nodular pattern. [facility] 04/21/2019   Basal cell carcinoma 04/21/2019   right lower flank, nodular pattern. 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 There is no height or weight on file to calculate BMI. Constitutional Well developed. Well nourished.  Vascular Dorsalis pedis pulses palpable bilaterally. Posterior tibial pulses palpable  bilaterally. Capillary refill normal to all digits.  No cyanosis or clubbing noted. Pedal hair growth normal.  Neurologic Normal speech. Oriented to person, place, and time. Epicritic sensation to light touch grossly present bilaterally.  Dermatologic Nails well groomed and normal in appearance. No open wounds. No skin lesions.  Orthopedic: Interdigital heloma molle noted between fourth and fifth digit with primary deforming force of the fourth digit.  Exostosis noted at the fourth toe leading to excessive pressure in between the toes.  No hammertoe contracture adductovarus deformity of the fifth noted   Radiographs: 3 views of skeletally mature adult right foot: Exostosis of the fourth digit noted.  Mild hammertoe contracture of the fourth digit noted.  No contracture of the fifth digit noted Assessment:   1. Heloma molle   2. Bony exostosis   3. Preoperative examination    Plan:  Patient was evaluated and treated and all questions answered.  Right fourth digit and fifth digit heloma molle with underlying exostosis at the fourth -All questions and concerns were discussed with the patient in extensive detail -I discussed with him that given that he is failed all conservative treatment options at this time patient will benefit from exostectomy/resection of the exostosis of the fourth digit to create more interdigital space and take the pressure away.  Patient states understanding and would like to proceed with that. -I discussed my preoperative intraoperative and postoperative plan in extensive detail  he can be weightbearing as tolerated with a surgical shoe. -Informed surgical risk consent was reviewed and read aloud to the patient.  I reviewed the films.  I have discussed my findings with the patient in great detail.  I have discussed all risks including but not limited to infection, stiffness, scarring, limp, disability, deformity, damage to blood vessels and nerves, numbness, poor  healing, need for braces, arthritis, chronic pain, amputation, death.  All benefits and realistic expectations discussed in great detail.  I have made no promises as to the outcome.  I have provided realistic expectations.  I have offered the patient a 2nd opinion, which they have declined and assured me they preferred to proceed despite the risks   No follow-ups on file.

## 2021-02-08 ENCOUNTER — Telehealth: Payer: Self-pay

## 2021-02-08 NOTE — Telephone Encounter (Signed)
OFFICE SURGERY 02/16/2021  EXOSTECTOMY 4TH RT - [place] EFFFECTIVE DATE 01/26/2020  Notification or Prior Authorization is not required for the requested services  This [employer] plan does not currently require a prior authorization for these services. If you have general questions about the prior authorization requirements, please call us at [phone] or visit [email or web address] > Clinician Resources > Advance and Admission Notification Requirements. The number above acknowledges your notification. Please write this number down for future reference. Notification is not a guarantee of coverage or payment.  Decision ID #:[id number]

## 2021-02-16 ENCOUNTER — Ambulatory Visit (INDEPENDENT_AMBULATORY_CARE_PROVIDER_SITE_OTHER): Payer: 59 | Admitting: Podiatry

## 2021-02-16 ENCOUNTER — Encounter: Payer: Self-pay | Admitting: Podiatry

## 2021-02-16 ENCOUNTER — Other Ambulatory Visit: Payer: Self-pay

## 2021-02-16 DIAGNOSIS — M898X9 Other specified disorders of bone, unspecified site: Secondary | ICD-10-CM | POA: Diagnosis not present

## 2021-02-16 NOTE — Progress Notes (Signed)
Surgeon: Dr. Boneta Lucks Assistants: None Pre-operative diagnosis: Exostosis toe right fourth digit Post-operative diagnosis: same Procedure: Right fourth digit exostectomy Pathology: None Pertinent Intra-op findings: Hypertrophic bone noted of the right fourth digit Anesthesia: 6 cc of one-to-one mixture of 1% lidocaine plain half percent Marcaine plain Hemostasis: Ankle tourniquet for 20 minutes EBL: Minimal Materials: 3-0 Monocryl and 3-0 Prolene Injectables: None Complications: None  Indications for surgery: A 50 y.o. male presents with right fourth digit exostosis. Patient has failed all conservative therapy including but not limited to debridement, padding, spacer. He wishes to have surgical correction of the foot/deformity. It was determined that patient would benefit from right fourth digit exostectomy. Informed surgical risk consent was reviewed and read aloud to the patient.  I reviewed the films.  I have discussed my findings with the patient in great detail.  I have discussed all risks including but not limited to infection, stiffness, scarring, limp, disability, deformity, damage to blood vessels and nerves, numbness, poor healing, need for braces, arthritis, chronic pain, amputation, death.  All benefits and realistic expectations discussed in great detail.  I have made no promises as to the outcome.  I have provided realistic expectations.  I have offered the patient a 2nd opinion, which they have declined and assured me they preferred to proceed despite the risks   Procedure in detail: The patient was both verbally and visually identified by myself, the nursing staff, and anesthesia staff in the preoperative holding area. They were then transferred to the operating room and placed on the operative table in supine position.  Attention was directed to the right dorsal digit, a skin marker was used to delineate a longitudinal incision on the dorsal aspect of the fourth digit.   Using a #15 blade the skin incision was carried down from epidermal dermal junction down to the level of the tendon.  At this time a transverse tenotomy was performed in standard technique of the extensor tendon and exposure of the lateral surface of the fourth proximal phalanx and base of the middle phalanx was noted.  Exostosis was noted at this time.  Using sagittal saw the lateral aspect of the PIPJ joint and both the head of the proximal phalanx and the base of the middle phalanx was resected to create space.  This will help alleviate the pressure and away and therefore the pain.  The incision was thoroughly irrigated.  Good correction alignment noted.  The extensor tendon was repaired with 3-0 Monocryl.  The skin was closed with 3-0 Prolene.  The incision was dressed with Betadine, Xeroform, Kerlix, Ace bandage.  All bony prominences were adequately padded  At the conclusion of the procedure the patient was awoken from anesthesia and found to have tolerated the procedure well any complications. There were transferred to PACU with vital signs stable and vascular status intact.  Boneta Lucks, DPM

## 2021-02-22 ENCOUNTER — Encounter: Payer: 59 | Admitting: Podiatry

## 2021-02-24 ENCOUNTER — Ambulatory Visit (INDEPENDENT_AMBULATORY_CARE_PROVIDER_SITE_OTHER): Payer: 59

## 2021-02-24 ENCOUNTER — Ambulatory Visit (INDEPENDENT_AMBULATORY_CARE_PROVIDER_SITE_OTHER): Payer: 59 | Admitting: Podiatry

## 2021-02-24 ENCOUNTER — Other Ambulatory Visit: Payer: Self-pay

## 2021-02-24 DIAGNOSIS — M898X9 Other specified disorders of bone, unspecified site: Secondary | ICD-10-CM

## 2021-02-24 DIAGNOSIS — Z9889 Other specified postprocedural states: Secondary | ICD-10-CM

## 2021-02-24 NOTE — Progress Notes (Signed)
  Subjective:  Patient ID: Wayne Sanford, male    DOB: Aug 16, 1970,  MRN: [id number]  Chief Complaint  Patient presents with   Routine Post Op    POV #1 DOS 02/16/2021 RT 4TH DIGIT EXOSTECTOMY    DOS: 02/16/2021 Procedure: Right fourth digit exostectomy  50 y.o. male returns for post-op check.  Patient states that he is doing well.  No acute complaints.  He has been ambulating with surgical shoe.  Bandages clean dry and intact  Review of Systems: Negative except as noted in the HPI. Denies N/V/F/Ch.  Past Medical History:  Diagnosis Date   Basal cell carcinoma 09/19/2012   mid chest, nodular pattern   Basal cell carcinoma 03/21/2013   right base of neck, nodular pattern   Basal cell carcinoma 04/21/2019   right lower chest, nodular pattern. [facility] 04/21/2019   Basal cell carcinoma 04/21/2019   right lower flank, nodular pattern. 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 Hypertrophic SCCis    Current Outpatient Medications:    aspirin 81 MG EC tablet, Take by mouth., Disp: , Rfl:   Social History   Tobacco Use  Smoking Status Every Day   Packs/day: 1.00   Years: 20.00   Pack years: 20.00   Types: Cigarettes  Smokeless Tobacco Never    No Known Allergies Objective:  There were no vitals filed for this visit. There is no height or weight on file to calculate BMI. Constitutional Well developed. Well nourished.  Vascular Foot warm and well perfused. Capillary refill normal to all digits.   Neurologic Normal speech. Oriented to person, place, and time. Epicritic sensation to light touch grossly present bilaterally.  Dermatologic Skin healing well without signs of infection. Skin edges well coapted without signs of infection.  Orthopedic: Tenderness to palpation noted about the surgical site.   Radiographs: 3 views of skeletally mature the  right foot: Exostectomy noted of the right fourth digit.  No other bony abnormalities identified. Assessment:   1. Bony exostosis   2. Status post foot surgery    Plan:  Patient was evaluated and treated and all questions answered.  S/p foot surgery right -Progressing as expected post-operatively. -XR: None -WB Status: Weightbearing as tolerated in surgical shoe -Sutures: Intact.  No clinical signs of dehiscence noted.  No complication noted. -Medications: None -Foot redressed.  No follow-ups on file.

## 2021-03-08 ENCOUNTER — Ambulatory Visit (INDEPENDENT_AMBULATORY_CARE_PROVIDER_SITE_OTHER): Payer: 59 | Admitting: Podiatry

## 2021-03-08 ENCOUNTER — Encounter: Payer: 59 | Admitting: Podiatry

## 2021-03-08 ENCOUNTER — Other Ambulatory Visit: Payer: Self-pay

## 2021-03-08 DIAGNOSIS — M898X9 Other specified disorders of bone, unspecified site: Secondary | ICD-10-CM

## 2021-03-08 DIAGNOSIS — Z9889 Other specified postprocedural states: Secondary | ICD-10-CM

## 2021-03-08 NOTE — Progress Notes (Signed)
  Subjective:  Patient ID: Wayne Sanford, male    DOB: 1971-03-27,  MRN: [id number]  Chief Complaint  Patient presents with   Routine Post Op      POV #2 DOS 02/16/2021 RT 4TH DIGIT EXOSTECTOMY    DOS: 02/16/2021 Procedure: Right fourth digit exostectomy  50 y.o. male returns for post-op check.  Patient states that he is doing well.  No acute complaints.  He has been ambulating with surgical shoe.  Bandages clean dry and intact  Review of Systems: Negative except as noted in the HPI. Denies N/V/F/Ch.  Past Medical History:  Diagnosis Date   Basal cell carcinoma 09/19/2012   mid chest, nodular pattern   Basal cell carcinoma 03/21/2013   right base of neck, nodular pattern   Basal cell carcinoma 04/21/2019   right lower chest, nodular pattern. [facility] 04/21/2019   Basal cell carcinoma 04/21/2019   right lower flank, nodular pattern. 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 Hypertrophic SCCis    Current Outpatient Medications:    aspirin 81 MG EC tablet, Take by mouth., Disp: , Rfl:   Social History   Tobacco Use  Smoking Status Every Day   Packs/day: 1.00   Years: 20.00   Pack years: 20.00   Types: Cigarettes  Smokeless Tobacco Never    No Known Allergies Objective:  There were no vitals filed for this visit. There is no height or weight on file to calculate BMI. Constitutional Well developed. Well nourished.  Vascular Foot warm and well perfused. Capillary refill normal to all digits.   Neurologic Normal speech. Oriented to person, place, and time. Epicritic sensation to light touch grossly present bilaterally.  Dermatologic Skin cover the reoperative is.  Good correction alignment noted.  Good separation between the fourth and fifth digit noted.  Orthopedic: No further tenderness to palpation noted about the surgical site.    Radiographs: 3 views of skeletally mature the right foot: Exostectomy noted of the right fourth digit.  No other bony abnormalities identified. Assessment:   1. Bony exostosis   2. Status post foot surgery     Plan:  Patient was evaluated and treated and all questions answered.  S/p foot surgery right -Progressing as expected post-operatively. -XR: None -WB Status: Weightbearing as tolerated in surgical shoe -Sutures: Removed.  No clinical signs of dehiscence noted.  No complication noted. -Medications: None -Patient is officially discharged from my care.  If any foot and ankle issues arise in future of asked him to come see me.  He states understanding  No follow-ups on file.

## 2021-08-03 ENCOUNTER — Ambulatory Visit (INDEPENDENT_AMBULATORY_CARE_PROVIDER_SITE_OTHER): Payer: 59 | Admitting: Family

## 2021-08-03 ENCOUNTER — Encounter: Payer: Self-pay | Admitting: Family

## 2021-08-03 VITALS — BP 128/78 | HR 71 | Temp 97.7°F | Ht 71.0 in | Wt 222.2 lb

## 2021-08-03 DIAGNOSIS — Z87891 Personal history of nicotine dependence: Secondary | ICD-10-CM

## 2021-08-03 DIAGNOSIS — Z1211 Encounter for screening for malignant neoplasm of colon: Secondary | ICD-10-CM

## 2021-08-03 DIAGNOSIS — R350 Frequency of micturition: Secondary | ICD-10-CM | POA: Insufficient documentation

## 2021-08-03 DIAGNOSIS — Z125 Encounter for screening for malignant neoplasm of prostate: Secondary | ICD-10-CM

## 2021-08-03 DIAGNOSIS — C4491 Basal cell carcinoma of skin, unspecified: Secondary | ICD-10-CM

## 2021-08-03 DIAGNOSIS — Z8589 Personal history of malignant neoplasm of other organs and systems: Secondary | ICD-10-CM | POA: Diagnosis not present

## 2021-08-03 DIAGNOSIS — Z0000 Encounter for general adult medical examination without abnormal findings: Secondary | ICD-10-CM | POA: Insufficient documentation

## 2021-08-03 LAB — CBC WITH DIFFERENTIAL/PLATELET
Basophils Absolute: 0.1 10*3/uL (ref 0.0–0.1)
Basophils Relative: 0.6 % (ref 0.0–3.0)
Eosinophils Absolute: 0 10*3/uL (ref 0.0–0.7)
Eosinophils Relative: 0.2 % (ref 0.0–5.0)
HCT: 44.8 % (ref 39.0–52.0)
Hemoglobin: 15.5 g/dL (ref 13.0–17.0)
Lymphocytes Relative: 26.5 % (ref 12.0–46.0)
Lymphs Abs: 2.2 10*3/uL (ref 0.7–4.0)
MCHC: 34.6 g/dL (ref 30.0–36.0)
MCV: 84.8 fl (ref 78.0–100.0)
Monocytes Absolute: 0.6 10*3/uL (ref 0.1–1.0)
Monocytes Relative: 7.5 % (ref 3.0–12.0)
Neutro Abs: 5.3 10*3/uL (ref 1.4–7.7)
Neutrophils Relative %: 65.2 % (ref 43.0–77.0)
Platelets: 217 10*3/uL (ref 150.0–400.0)
RBC: 5.29 Mil/uL (ref 4.22–5.81)
RDW: 13.4 % (ref 11.5–15.5)
WBC: 8.2 10*3/uL (ref 4.0–10.5)

## 2021-08-03 LAB — URINALYSIS, ROUTINE W REFLEX MICROSCOPIC
Bilirubin Urine: NEGATIVE
Hgb urine dipstick: NEGATIVE
Ketones, ur: NEGATIVE
Leukocytes,Ua: NEGATIVE
Nitrite: NEGATIVE
RBC / HPF: NONE SEEN (ref 0–?)
Specific Gravity, Urine: 1.015 (ref 1.000–1.030)
Total Protein, Urine: NEGATIVE
Urine Glucose: 100 — AB
Urobilinogen, UA: 0.2 (ref 0.0–1.0)
pH: 6 (ref 5.0–8.0)

## 2021-08-03 LAB — PSA: PSA: 0.7 ng/mL (ref 0.10–4.00)

## 2021-08-03 LAB — COMPREHENSIVE METABOLIC PANEL
ALT: 32 U/L (ref 0–53)
AST: 20 U/L (ref 0–37)
Albumin: 4.4 g/dL (ref 3.5–5.2)
Alkaline Phosphatase: 75 U/L (ref 39–117)
BUN: 17 mg/dL (ref 6–23)
CO2: 29 mEq/L (ref 19–32)
Calcium: 9.6 mg/dL (ref 8.4–10.5)
Chloride: 103 mEq/L (ref 96–112)
Creatinine, Ser: 1.19 mg/dL (ref 0.40–1.50)
GFR: 70.78 mL/min (ref >60.00)
Glucose, Bld: 147 mg/dL — ABNORMAL HIGH (ref 70–99)
Potassium: 4.2 mEq/L (ref 3.5–5.1)
Sodium: 138 mEq/L (ref 135–145)
Total Bilirubin: 1 mg/dL (ref 0.2–1.2)
Total Protein: 6.9 g/dL (ref 6.0–8.3)

## 2021-08-03 LAB — LIPID PANEL
Cholesterol: 137 mg/dL (ref 0–200)
HDL: 44.8 mg/dL (ref >39.00)
LDL Cholesterol: 74 mg/dL (ref 0–99)
NonHDL: 92.24
Total CHOL/HDL Ratio: 3
Triglycerides: 92 mg/dL (ref 0.0–149.0)
VLDL: 18.4 mg/dL (ref 0.0–40.0)

## 2021-08-03 LAB — VITAMIN D 25 HYDROXY (VIT D DEFICIENCY, FRACTURES): VITD: 22.34 ng/mL — ABNORMAL LOW (ref 30.00–100.00)

## 2021-08-03 LAB — TSH: TSH: 1.25 u[IU]/mL (ref 0.35–5.50)

## 2021-08-03 NOTE — Progress Notes (Signed)
Patient has not been to Dr in years. I ordered labs and he needs colonoscopy as well ?

## 2021-08-03 NOTE — Patient Instructions (Signed)
Referral to urology, [facility] for lung cancer screening program and to gastroenterology for colonoscopy ? ?Let us know if you dont hear back within a week in regards to an appointment being scheduled.  ? ?Nice to meet you! ? ?

## 2021-08-03 NOTE — Progress Notes (Signed)
? ?Subjective:  ? ? Patient ID: Wayne Sanford, male    DOB: 1970-08-11, 51 y.o.   MRN: [id number] ? ?CC: Wayne Sanford is a 51 y.o. male who presents today to establish care.   ? ?HPI: Complains of left lower groin pain, dull pain x for years, comes and goes. Not worse or more frequent.   ?If squats or rubs hand against testicle,  he will experience dull pelvic pain. It is not excruciating.  ?No constipation, fever, nausea, vomiting, changes in appetite, dysuria, nocturia, pain with bowel movement, unusual penile discharge, testicular pain or masses. No concern for STD ? ?Endorses urinary frequency and cannot 'hold bladder as long', weak stream.  ? ? ?Smoker ? ?History of basal cell carcinoma, squamous cell carcinoma.  ?HISTORY:  ?Past Medical History:  ?Diagnosis Date  ? Basal cell carcinoma 09/19/2012  ? mid chest, nodular pattern  ? Basal cell carcinoma 03/21/2013  ? right base of neck, nodular pattern  ? Basal cell carcinoma 04/21/2019  ? right lower chest, nodular pattern. [facility] 04/21/2019  ? Basal cell carcinoma 04/21/2019  ? right lower flank, nodular pattern. [facility] 04/21/2019  ? Nonmelanoma skin cancer   ? Basal and squamous cell--multiple ([place] [facility]--f/u q65mo  ? Squamous cell carcinoma of skin 03/21/2013  ? left clavicle. Hypertrophic SCCis  ? ?Past Surgical History:  ?Procedure Laterality Date  ? CHOLECYSTECTOMY  04/18/2007  ? MOHS SURGERY    ? ?Family History  ?Adopted: Yes  ? ? ?Allergies: Patient has no known allergies. ?Current Outpatient Medications on File Prior to Visit  ?Medication Sig Dispense Refill  ? aspirin 81 MG EC tablet Take by mouth. (Patient not taking: Reported on 08/03/2021)    ? ?No current facility-administered medications on file prior to visit.  ? ? ?Social History  ? ?Tobacco Use  ? Smoking status: Former  ?  Packs/day: 1.00  ?  Years: 20.00  ?  Pack years: 20.00  ?  Types: Cigarettes  ?  Quit date: 04/26/2020  ?  Years since quitting: 1.2  ? Smokeless tobacco: Current   ?Substance Use Topics  ? Alcohol use: No  ? Drug use: No  ? ? ?Review of Systems  ?Constitutional:  Negative for chills and fever.  ?HENT:  Negative for congestion, ear pain, rhinorrhea, sinus pressure and sore throat.   ?Respiratory:  Negative for cough, shortness of breath and wheezing.   ?Cardiovascular:  Negative for chest pain and palpitations.  ?Gastrointestinal:  Negative for diarrhea, nausea and vomiting.  ?Genitourinary:  Positive for difficulty urinating and frequency. Negative for dysuria and testicular pain.  ?Musculoskeletal:  Negative for myalgias.  ?Skin:  Negative for rash.  ?Neurological:  Negative for headaches.  ?Hematological:  Negative for adenopathy.  ?   ?Objective:  ?  ?BP 128/78 (BP Location: Left Arm, Patient Position: Sitting, Cuff Size: Large)   Pulse 71   Temp 97.7 ?F (36.5 ?C) (Oral)   Ht '5\' 11"'$  (1.803 m)   Wt 222 lb 3.2 oz (100.8 kg)   SpO2 98%   BMI 30.99 kg/m?  ?BP Readings from Last 3 Encounters:  ?08/03/21 128/78  ?01/06/14 118/75  ?01/05/14 108/63  ? ?Wt Readings from Last 3 Encounters:  ?08/03/21 222 lb 3.2 oz (100.8 kg)  ?01/06/14 210 lb (95.3 kg)  ?01/05/14 215 lb (97.5 kg)  ? ? ?Physical Exam ?Vitals reviewed.  ?Constitutional:   ?   Appearance: Normal appearance. He is well-developed.  ?Cardiovascular:  ?   Rate  and Rhythm: Regular rhythm.  ?   Heart sounds: Normal heart sounds.  ?Pulmonary:  ?   Effort: Pulmonary effort is normal. No respiratory distress.  ?   Breath sounds: Normal breath sounds. No wheezing, rhonchi or rales.  ?Abdominal:  ?   General: Bowel sounds are normal. There is no distension.  ?   Palpations: Abdomen is soft. Abdomen is not rigid. There is no fluid wave or mass.  ?   Tenderness: There is no abdominal tenderness. There is no guarding or rebound. Negative signs include Murphy's sign and McBurney's sign.  ?Genitourinary: ?   Penis: No tenderness.   ?   Testes: Cremasteric reflex is present.     ?   Right: Mass or tenderness not present.     ?    Left: Mass or tenderness not present.  ?Skin: ?   General: Skin is warm and dry.  ?Neurological:  ?   Mental Status: He is alert.  ?Psychiatric:     ?   Speech: Speech normal.     ?   Behavior: Behavior normal.  ? ? ?   ?Assessment & Plan:  ? ?Problem List Items Addressed This Visit   ? ?  ? Musculoskeletal and Integument  ? Basal cell carcinoma  ?  Overdue for surveillance with dermatology and referral placed.  ?  ?  ?  ? Other  ? Encounter for medical examination to establish care - Primary  ?  Reviewed past medical history.  Baseline screening labs ordered.  ? ?  ?  ? Relevant Orders  ? Ambulatory referral to Dermatology  ? Ambulatory referral to Gastroenterology  ? Ambulatory Referral for Lung Cancer Scre  ? Urinalysis, Routine w reflex microscopic (Completed)  ? Urine Culture (Completed)  ? Ambulatory referral to Urology  ? HIV Antibody (routine testing w rflx) (Completed)  ? Hepatitis C antibody (Completed)  ? VITAMIN D 25 Hydroxy (Vit-D Deficiency, Fractures) (Completed)  ? Urinary frequency  ?  Presentation consistent with prostate etiology, question BPH versus chronic prostatitis. Opted not to perform prostate exam as planned to draw PSA lab today. Pending urine studies, referral to urology. If urology evaluation unremarkable will start GI evaluation. He is pending colonoscopy as it is over due. Close follow up. ? ?  ?  ? Relevant Orders  ? Urinalysis, Routine w reflex microscopic (Completed)  ? Urine Culture (Completed)  ? Ambulatory referral to Urology  ? HIV Antibody (routine testing w rflx) (Completed)  ? Hepatitis C antibody (Completed)  ? ?Other Visit Diagnoses   ? ? Routine history and physical examination of adult      ? Relevant Orders  ? CBC with Differential/Platelet (Completed)  ? Comprehensive metabolic panel (Completed)  ? Lipid panel (Completed)  ? TSH (Completed)  ? PSA (Completed)  ? Former smoker      ? Relevant Orders  ? Ambulatory Referral for Lung Cancer Scre  ? Screen for colon cancer       ? Relevant Orders  ? Ambulatory referral to Gastroenterology  ? History of squamous cell carcinoma      ? Relevant Orders  ? Ambulatory referral to Dermatology  ? ?  ? ? ? ?I am having Wayne Sanford "Wayne Sanford" maintain his aspirin. ? ? ?No orders of the defined types were placed in this encounter. ? ? ?Return precautions given.  ? ?Risks, benefits, and alternatives of the medications and treatment plan prescribed today were discussed, and patient expressed understanding.  ? ?  Education regarding symptom management and diagnosis given to patient on AVS. ? ?Continue to follow with Burnard Hawthorne, FNP for routine health maintenance.  ? ?Wayne Sanford and I agreed with plan.  ? ?[name], FNP ? ? ? ? ?

## 2021-08-04 ENCOUNTER — Telehealth: Payer: Self-pay

## 2021-08-04 LAB — URINE CULTURE
MICRO NUMBER:: 13284580
Result:: NO GROWTH
SPECIMEN QUALITY:: ADEQUATE

## 2021-08-04 LAB — HEPATITIS C ANTIBODY
Hepatitis C Ab: NONREACTIVE
SIGNAL TO CUT-OFF: 0.05 (ref <1.00)

## 2021-08-04 LAB — HIV ANTIBODY (ROUTINE TESTING W REFLEX): HIV 1&2 Ab, 4th Generation: NONREACTIVE

## 2021-08-04 NOTE — Telephone Encounter (Signed)
CALLED PATIENT NO ANSWER LEFT VOICEMAIL FOR A CALL BACK ? ?

## 2021-08-05 ENCOUNTER — Encounter: Payer: Self-pay | Admitting: Family

## 2021-08-05 NOTE — Assessment & Plan Note (Signed)
Overdue for surveillance with dermatology and referral placed.  ?

## 2021-08-05 NOTE — Assessment & Plan Note (Signed)
Presentation consistent with prostate etiology, question BPH versus chronic prostatitis. Opted not to perform prostate exam as planned to draw PSA lab today. Pending urine studies, referral to urology. If urology evaluation unremarkable will start GI evaluation. He is pending colonoscopy as it is over due. Close follow up. ?

## 2021-08-05 NOTE — Assessment & Plan Note (Addendum)
Reviewed past medical history.  Baseline screening labs ordered.  ?

## 2021-08-08 ENCOUNTER — Other Ambulatory Visit: Payer: Self-pay

## 2021-08-08 ENCOUNTER — Telehealth: Payer: Self-pay

## 2021-08-08 DIAGNOSIS — Z1211 Encounter for screening for malignant neoplasm of colon: Secondary | ICD-10-CM

## 2021-08-08 MED ORDER — NA SULFATE-K SULFATE-MG SULF 17.5-3.13-1.6 GM/177ML PO SOLN: 1.0000 | Freq: Once | ORAL | 0 refills | Status: AC

## 2021-08-08 NOTE — Telephone Encounter (Signed)
Gastroenterology Pre-Procedure Review ? ?Request Date: Thursday 09/01/21 ?Requesting Physician: Dr. Vicente Males ? ?PATIENT REVIEW QUESTIONS: The patient responded to the following health history questions as indicated:   ? ?1. Are you having any GI issues? no ?2. Do you have a personal history of Polyps? no ?3. Do you have a family history of Colon Cancer or Polyps? no ?4. Diabetes Mellitus? no ?5. Joint replacements in the past 12 months?no ?6. Major health problems in the past 3 months?no ?7. Any artificial heart valves, MVP, or defibrillator?no ?   ?MEDICATIONS & ALLERGIES:    ?Patient reports the following regarding taking any anticoagulation/antiplatelet therapy:   ?Plavix, Coumadin, Eliquis, Xarelto, Lovenox, Pradaxa, Brilinta, or Effient? no ?Aspirin? no ? ?Patient confirms/reports the following medications:  ?Current Outpatient Medications  ?Medication Sig Dispense Refill  ? aspirin 81 MG EC tablet Take by mouth. (Patient not taking: Reported on 08/03/2021)    ? ?No current facility-administered medications for this visit.  ? ? ?Patient confirms/reports the following allergies:  ?No Known Allergies ? ?No orders of the defined types were placed in this encounter. ? ? ?AUTHORIZATION INFORMATION ?Primary Insurance: ?1D#: ?Group #: ? ?Secondary Insurance: ?1D#: ?Group #: ? ?SCHEDULE INFORMATION: ?Date: Thursday 09/01/21 ?Time: ?Location: [facility] ?

## 2021-08-09 ENCOUNTER — Other Ambulatory Visit: Payer: Self-pay

## 2021-08-09 DIAGNOSIS — R81 Glycosuria: Secondary | ICD-10-CM

## 2021-08-17 ENCOUNTER — Ambulatory Visit (INDEPENDENT_AMBULATORY_CARE_PROVIDER_SITE_OTHER): Payer: 59 | Admitting: Urology

## 2021-08-17 ENCOUNTER — Encounter: Payer: Self-pay | Admitting: Urology

## 2021-08-17 VITALS — BP 116/73 | HR 64 | Ht 71.0 in | Wt 230.0 lb

## 2021-08-17 DIAGNOSIS — R350 Frequency of micturition: Secondary | ICD-10-CM

## 2021-08-17 DIAGNOSIS — R102 Pelvic and perineal pain: Secondary | ICD-10-CM | POA: Diagnosis not present

## 2021-08-17 DIAGNOSIS — R399 Unspecified symptoms and signs involving the genitourinary system: Secondary | ICD-10-CM

## 2021-08-17 LAB — URINALYSIS, COMPLETE
Bilirubin, UA: NEGATIVE
Glucose, UA: NEGATIVE
Ketones, UA: NEGATIVE
Leukocytes,UA: NEGATIVE
Nitrite, UA: NEGATIVE
Protein,UA: NEGATIVE
RBC, UA: NEGATIVE
Specific Gravity, UA: 1.025 (ref 1.005–1.030)
Urobilinogen, Ur: 0.2 mg/dL (ref 0.2–1.0)
pH, UA: 6 (ref 5.0–7.5)

## 2021-08-17 LAB — MICROSCOPIC EXAMINATION: Bacteria, UA: NONE SEEN

## 2021-08-17 LAB — BLADDER SCAN AMB NON-IMAGING: Scan Result: 0

## 2021-08-17 NOTE — Progress Notes (Signed)
? ?08/17/2021 ?8:13 AM  ? ?Wayne Sanford ?1970/08/10 ?[id number] ? ?Referring provider: Burnard Hawthorne, FNP ?[phone] ?[address] ?[place],  [place] [id number] ? ?Chief Complaint  ?Patient presents with  ? Urinary Frequency  ? ? ?HPI: ?Wayne Sanford is a 51 y.o. male referred for evaluation of lower urinary tract symptoms. ? ?Recently has noted lower urinary tract symptoms including frequency, weak urinary stream, urgency and sensation of incomplete emptying ?IPSS 11/35 ?Denies dysuria, gross hematuria ?~ 2 year history of dull left groin/pelvic discomfort and increased testicular sensitivity ?PSA performed last month 0.7 ?Denies dysuria or gross hematuria ?No prior history urologic problems ? ? ?PMH: ?Past Medical History:  ?Diagnosis Date  ? Basal cell carcinoma 09/19/2012  ? mid chest, nodular pattern  ? Basal cell carcinoma 03/21/2013  ? right base of neck, nodular pattern  ? Basal cell carcinoma 04/21/2019  ? right lower chest, nodular pattern. [facility] 04/21/2019  ? Basal cell carcinoma 04/21/2019  ? right lower flank, nodular pattern. [facility] 04/21/2019  ? Nonmelanoma skin cancer   ? Basal and squamous cell--multiple ([place] [facility]--f/u q2mo  ? Squamous cell carcinoma of skin 03/21/2013  ? left clavicle. Hypertrophic SCCis  ? ? ?Surgical History: ?Past Surgical History:  ?Procedure Laterality Date  ? CHOLECYSTECTOMY  04/18/2007  ? MOHS SURGERY    ? ? ?Home Medications:  ?Allergies as of 08/17/2021   ?No Known Allergies ?  ? ?  ?Medication List  ?  ? ?  ? Accurate as of Aug 17, 2021  8:13 AM. If you have any questions, ask your nurse or doctor.  ?  ?  ? ?  ? ?STOP taking these medications   ? ?aspirin 81 MG EC tablet ?Stopped by: SAbbie Sons MD ?  ? ?  ? ? ?Allergies: No Known Allergies ? ?Family History: ?Family History  ?Adopted: Yes  ? ? ?Social History:  reports that he quit smoking about 15 months ago. His smoking use included cigarettes. He has a 20.00 pack-year smoking history. He uses smokeless  tobacco. He reports that he does not drink alcohol and does not use drugs. ? ? ?Physical Exam: ?BP 116/73   Pulse 64   Ht '5\' 11"'$  (1.803 m)   Wt 230 lb (104.3 kg)   BMI 32.08 kg/m?   ?Constitutional:  Alert and oriented, No acute distress. ?HEENT:  AT, moist mucus membranes.  Trachea midline, no masses. ?Cardiovascular: No clubbing, cyanosis, or edema. ?Respiratory: Normal respiratory effort, no increased work of breathing. ?GI: Abdomen is soft, nontender, nondistended, no abdominal masses ?GU: Phallus without lesions.  Testes descended bilateral without masses or tenderness.  Spermatic cord/epididymis palpably normal bilaterally.  Prostate 40 g, smooth without nodules ?Skin: No rashes, bruises or suspicious lesions. ?Neurologic: Grossly intact, no focal deficits, moving all 4 extremities. ?Psychiatric: Normal mood and affect. ? ?Laboratory Data: ? ?Lab Results  ?Component Value Date  ? PSA 0.70 08/03/2021  ? ?Urinalysis ?Dipstick/microscopy negative ? ? ?Assessment & Plan:   ? ?1.  Lower urinary tract symptoms ?Moderate LUTS most likely secondary to BPH ?States his symptoms are currently not bothersome enough that he desires medical management ?PVR today 0 mL ? ?2.  Pelvic pain ?No abnormalities on exam ?He was interested in pursuing a pelvic CT and order entered.  Will call with results.  ? ?If no abnormalities noted on CT I offered him annual follow-up versus prn and he elected the latter. ? ? ?SAbbie Sons MD ? ?  [place] ?[address] ?[place], [place] [address] ?[phone] ? ?

## 2021-08-22 ENCOUNTER — Other Ambulatory Visit (INDEPENDENT_AMBULATORY_CARE_PROVIDER_SITE_OTHER): Payer: 59

## 2021-08-22 ENCOUNTER — Encounter: Payer: Self-pay | Admitting: Family

## 2021-08-22 DIAGNOSIS — E119 Type 2 diabetes mellitus without complications: Secondary | ICD-10-CM | POA: Insufficient documentation

## 2021-08-22 DIAGNOSIS — R81 Glycosuria: Secondary | ICD-10-CM

## 2021-08-22 LAB — HEMOGLOBIN A1C: Hgb A1c MFr Bld: 7.2 % — ABNORMAL HIGH (ref 4.6–6.5)

## 2021-08-22 LAB — URINALYSIS, ROUTINE W REFLEX MICROSCOPIC
Bilirubin Urine: NEGATIVE
Hgb urine dipstick: NEGATIVE
Ketones, ur: NEGATIVE
Leukocytes,Ua: NEGATIVE
Nitrite: NEGATIVE
RBC / HPF: NONE SEEN (ref 0–?)
Specific Gravity, Urine: 1.02 (ref 1.000–1.030)
Total Protein, Urine: NEGATIVE
Urine Glucose: NEGATIVE
Urobilinogen, UA: 1 (ref 0.0–1.0)
pH: 6 (ref 5.0–8.0)

## 2021-08-24 ENCOUNTER — Ambulatory Visit (INDEPENDENT_AMBULATORY_CARE_PROVIDER_SITE_OTHER): Payer: 59 | Admitting: Dermatology

## 2021-08-24 DIAGNOSIS — L820 Inflamed seborrheic keratosis: Secondary | ICD-10-CM

## 2021-08-24 DIAGNOSIS — C4441 Basal cell carcinoma of skin of scalp and neck: Secondary | ICD-10-CM | POA: Diagnosis not present

## 2021-08-24 DIAGNOSIS — L578 Other skin changes due to chronic exposure to nonionizing radiation: Secondary | ICD-10-CM | POA: Diagnosis not present

## 2021-08-24 DIAGNOSIS — C44519 Basal cell carcinoma of skin of other part of trunk: Secondary | ICD-10-CM

## 2021-08-24 DIAGNOSIS — L905 Scar conditions and fibrosis of skin: Secondary | ICD-10-CM

## 2021-08-24 DIAGNOSIS — L720 Epidermal cyst: Secondary | ICD-10-CM

## 2021-08-24 DIAGNOSIS — C44619 Basal cell carcinoma of skin of left upper limb, including shoulder: Secondary | ICD-10-CM | POA: Diagnosis not present

## 2021-08-24 DIAGNOSIS — D485 Neoplasm of uncertain behavior of skin: Secondary | ICD-10-CM

## 2021-08-24 DIAGNOSIS — L570 Actinic keratosis: Secondary | ICD-10-CM | POA: Diagnosis not present

## 2021-08-24 NOTE — Progress Notes (Signed)
? ?Follow-Up Visit ?  ?Subjective  ?Wayne Sanford is a 51 y.o. male who presents for the following: UBSE (The patient presents for Upper Body Skin Exam (UBSE) for skin cancer screening and mole check.  The patient has spots, moles and lesions to be evaluated, some may be new or changing and the patient has concerns that these could be cancer. 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 He does have a spot at right face that feels sharp, a possible skin tag at left ear and a spot that has been removed from the groin area in the past but has come back./).  Spot in ear gets sore. ? ? ? ?The following portions of the chart were reviewed this encounter and updated as appropriate:  ?  ?  ? ?Review of Systems:  No other skin or systemic complaints except as noted in HPI or Assessment and Plan. ? ?Objective  ?Well appearing patient in no apparent distress; mood and affect are within normal limits. ? ?All skin waist up examined. ? ?R face x 12, L face x 10, nose x 2, crown x 5, L lower back x 2, R upper back x 3, L lower flank x 2, R mid lower back x 3 (39) ?Pink scaly macules ? ? ? ? ? ? ? ? ?right inferior zygoma x 2, left zygoma x 1 (3) ?Erythematous stuck-on, waxy papule  ? ?Left Ear canal ?Smooth white papule(s).  ? ?right lower chest, right lower flank ?Thickened firm papule ? ?right lower flank ?1.2 pink scaly patch  ? ? ? ? ?right posterior lower neck ?0.6 cm pink pearly papule  ? ? ? ? ?(A) spinal mid back medial within scar ?Recurrent 7 mm pink pearly papule - [id number] ? ? ? ? ?(B) spinal mid back lateral within scar ?Recurrent 7 mm pink pearly papule - [id number] ? ? ? ? ?left top of shoulder ?9 mm pink pearly papule  ? ? ? ? ? ? ?Assessment & Plan  ?AK (actinic keratosis) (39) ?R face x 12, L face x 10, nose x 2, crown x 5, L lower back x 2, R upper back x 3, L lower flank x 2, R mid lower back x 3 ? ?Vs early BCCs at back and flank ? ?Recheck on f/up ? ?Actinic keratoses are precancerous spots that appear  secondary to cumulative UV radiation exposure/sun exposure over time. They are chronic with expected duration over 1 year. A portion of actinic keratoses will progress to squamous cell carcinoma of the skin. It is not possible to reliably predict which spots will progress to skin cancer and so treatment is recommended to prevent development of skin cancer. ? ?Recommend daily broad spectrum sunscreen SPF 30+ to sun-exposed areas, reapply every 2 hours as needed.  ?Recommend staying in the shade or wearing long sleeves, sun glasses (UVA+UVB protection) and wide brim hats (4-inch brim around the entire circumference of the hat). ?Call for new or changing lesions.  ? ?Destruction of lesion - R face x 12, L face x 10, nose x 2, crown x 5, L lower back x 2, R upper back x 3, L lower flank x 2, R mid lower back x 3 ? ?Destruction method: cryotherapy   ?Informed consent: discussed and consent obtained   ?Lesion destroyed using liquid nitrogen: Yes   ?Region frozen until ice ball extended beyond lesion: Yes   ?Outcome: patient tolerated procedure well with no complications   ?Post-procedure details: wound  care instructions given   ?Additional details:  Prior to procedure, discussed risks of blister formation, small wound, skin dyspigmentation, or rare scar following cryotherapy. Recommend Vaseline ointment to treated areas while healing.  ? ?Inflamed seborrheic keratosis (3) ?right inferior zygoma x 2, left zygoma x 1 ? ?Destruction of lesion - right inferior zygoma x 2, left zygoma x 1 ? ?Destruction method: cryotherapy   ?Informed consent: discussed and consent obtained   ?Lesion destroyed using liquid nitrogen: Yes   ?Region frozen until ice ball extended beyond lesion: Yes   ?Outcome: patient tolerated procedure well with no complications   ?Post-procedure details: wound care instructions given   ?Additional details:  Prior to procedure, discussed risks of blister formation, small wound, skin dyspigmentation, or rare  scar following cryotherapy. Recommend Vaseline ointment to treated areas while healing.  ? ?Milia ?Left Ear canal ? ?Irritated  ? ?Acne/Milia surgery - Left Ear canal ?Procedure risks and benefits were discussed with the patient and verbal consent was obtained. Following prep of the skin on the left ear canal with an alcohol swab and local anesthesia injection, extraction of milia was performed with a comedone extractor following superficial incision made over their surfaces with a #11 surgical blade. Capillary hemostasis was achieved with 20% aluminum chloride solution. Vaseline ointment was applied to each site. The patient tolerated the procedure well. ? ?Scar ?right lower chest, right lower flank ? ?Hypertrophic, at [facility] site, clear ? ?Benign, observe.   ? ?Neoplasm of uncertain behavior of skin (5) ?right lower flank ? ?Skin / nail biopsy ?Type of biopsy: tangential   ?Informed consent: discussed and consent obtained   ?Patient was prepped and draped in usual sterile fashion: Area prepped with alcohol. ?Anesthesia: the lesion was anesthetized in a standard fashion   ?Anesthetic:  1% lidocaine w/ epinephrine 1-100,000 buffered w/ 8.4% NaHCO3 ?Instrument used: flexible razor blade   ?Hemostasis achieved with: pressure, aluminum chloride and electrodesiccation   ?Outcome: patient tolerated procedure well   ? ?Destruction of lesion ? ?Destruction method: electrodesiccation and curettage   ?Informed consent: discussed and consent obtained   ?Timeout:  patient name, date of birth, surgical site, and procedure verified ?Curettage performed in three different directions: Yes   ?Electrodesiccation performed over the curetted area: Yes   ?Final wound size (cm):  1.3 ?Hemostasis achieved with:  pressure, aluminum chloride and electrodesiccation ?Outcome: patient tolerated procedure well with no complications   ?Post-procedure details: wound care instructions given   ?Additional details:  Mupirocin ointment and Bandaid  applied ?  ? ?Specimen 1 - Surgical pathology ?Differential Diagnosis: r/o BCC ? ?Check Margins: No ?1.2 pink scaly patch ? ?right posterior lower neck ? ?Skin / nail biopsy ?Type of biopsy: tangential   ?Informed consent: discussed and consent obtained   ?Patient was prepped and draped in usual sterile fashion: Area prepped with alcohol. ?Anesthesia: the lesion was anesthetized in a standard fashion   ?Anesthetic:  1% lidocaine w/ epinephrine 1-100,000 buffered w/ 8.4% NaHCO3 ?Instrument used: flexible razor blade   ?Hemostasis achieved with: pressure, aluminum chloride and electrodesiccation   ?Outcome: patient tolerated procedure well   ? ?Destruction of lesion ? ?Destruction method: electrodesiccation and curettage   ?Informed consent: discussed and consent obtained   ?Timeout:  patient name, date of birth, surgical site, and procedure verified ?Curettage performed in three different directions: Yes   ?Electrodesiccation performed over the curetted area: Yes   ?Final wound size (cm):  1 ?Hemostasis achieved with:  pressure, aluminum chloride and  electrodesiccation ?Outcome: patient tolerated procedure well with no complications   ? ?Specimen 2 - Surgical pathology ?Differential Diagnosis: r/o BCC ? ?Check Margins: No ?0.6 cm pink pearly papule ? ?(A) spinal mid back medial within scar ? ?Skin / nail biopsy ?Type of biopsy: tangential   ?Informed consent: discussed and consent obtained   ?Patient was prepped and draped in usual sterile fashion: Area prepped with alcohol. ?Anesthesia: the lesion was anesthetized in a standard fashion   ?Anesthetic:  1% lidocaine w/ epinephrine 1-100,000 buffered w/ 8.4% NaHCO3 ?Instrument used: flexible razor blade   ?Hemostasis achieved with: pressure, aluminum chloride and electrodesiccation   ?Outcome: patient tolerated procedure well   ? ?Destruction of lesion ? ?Destruction method: electrodesiccation and curettage   ?Informed consent: discussed and consent obtained    ?Timeout:  patient name, date of birth, surgical site, and procedure verified ?Curettage performed in three different directions: Yes   ?Electrodesiccation performed over the curetted area: Yes   ?Final wound size (cm

## 2021-08-24 NOTE — Patient Instructions (Addendum)
Cryotherapy Aftercare ? ?Wash gently with soap and water everyday.   ?Apply Vaseline and Band-Aid daily until healed.  ? ? ?Wound Care Instructions ? ?Cleanse wound gently with soap and water once a day then pat dry with clean gauze. Apply a thing coat of Petrolatum (petroleum jelly, "Vaseline") over the wound (unless you have an allergy to this). We recommend that you use a new, sterile tube of Vaseline. Do not pick or remove scabs. Do not remove the yellow or white "healing tissue" from the base of the wound. ? ?Cover the wound with fresh, clean, nonstick gauze and secure with paper tape. You may use Band-Aids in place of gauze and tape if the would is small enough, but would recommend trimming much of the tape off as there is often too much. Sometimes Band-Aids can irritate the skin. ? ?You should call the office for your biopsy report after 1 week if you have not already been contacted. ? ?If you experience any problems, such as abnormal amounts of bleeding, swelling, significant bruising, significant pain, or evidence of infection, please call the office immediately. ? ?FOR ADULT SURGERY PATIENTS: If you need something for pain relief you may take 1 extra strength Tylenol (acetaminophen) AND 2 Ibuprofen ('200mg'$  each) together every 4 hours as needed for pain. (do not take these if you are allergic to them or if you have a reason you should not take them.) Typically, you may only need pain medication for 1 to 3 days.  ? ? ?Recommend daily broad spectrum sunscreen SPF 30+ to sun-exposed areas, reapply every 2 hours as needed. Call for new or changing lesions.  ?Staying in the shade or wearing long sleeves, sun glasses (UVA+UVB protection) and wide brim hats (4-inch brim around the entire circumference of the hat) are also recommended for sun protection.  ? ?If You Need Anything After Your Visit ? ?If you have any questions or concerns for your doctor, please call our main line at [phone] and press option 4 to  reach your doctor's medical assistant. If no one answers, please leave a voicemail as directed and we will return your call as soon as possible. Messages left after 4 pm will be answered the following business day.  ? ?You may also send [place] a message via MyChart. We typically respond to MyChart messages within 1-2 business days. ? ?For prescription refills, please ask your pharmacy to contact our office. Our fax number is [phone]. ? ?If you have an urgent issue when the clinic is closed that cannot wait until the next business day, you can page your doctor at the number below.   ? ?Please note that while we do our best to be available for urgent issues outside of office hours, we are not available 24/7.  ? ?If you have an urgent issue and are unable to reach [place], you may choose to seek medical care at your doctor's office, retail clinic, urgent care center, or emergency room. ? ?If you have a medical emergency, please immediately call 911 or go to the emergency department. ? ?Pager Numbers ? ?- Dr. Nehemiah Massed: [phone] ? ?- Dr. Laurence Ferrari: [phone] ? ?- Dr. Nicole Kindred: [phone] ? ?In the event of inclement weather, please call our main line at [phone] for an update on the status of any delays or closures. ? ?Dermatology Medication Tips: ?Please keep the boxes that topical medications come in in order to help keep track of the instructions about where and how to use these. Pharmacies typically  print the medication instructions only on the boxes and not directly on the medication tubes.  ? ?If your medication is too expensive, please contact our office at [phone] option 4 or send [place] a message through [place].  ? ?We are unable to tell what your co-pay for medications will be in advance as this is different depending on your insurance coverage. However, we may be able to find a substitute medication at lower cost or fill out paperwork to get insurance to cover a needed medication.  ? ?If a prior  authorization is required to get your medication covered by your insurance company, please allow [place] 1-2 business days to complete this process. ? ?Drug prices often vary depending on where the prescription is filled and some pharmacies may offer cheaper prices. ? ?The website www.goodrx.com contains coupons for medications through different pharmacies. The prices here do not account for what the cost may be with help from insurance (it may be cheaper with your insurance), but the website can give you the price if you did not use any insurance.  ?- You can print the associated coupon and take it with your prescription to the pharmacy.  ?- You may also stop by our office during regular business hours and pick up a GoodRx coupon card.  ?- If you need your prescription sent electronically to a different pharmacy, notify our office through [facility] or by phone at [phone] option 4. ? ? ? ? ?Si Usted Necesita Algo Despu?s de Su Visita ? ?Tambi?n puede enviarnos un mensaje a trav?s de MyChart. Por lo general respondemos a los mensajes de MyChart en el transcurso de 1 a 2 d?as h?biles. ? ?Para renovar recetas, por favor pida a su farmacia que se ponga en contacto con nuestra oficina. Nuestro n?mero de fax es el [phone]. ? ?Si tiene un asunto urgente cuando la cl?nica est? cerrada y que no puede esperar hasta el siguiente d?a h?bil, puede llamar/localizar a su doctor(a) al n?mero que aparece a continuaci?n.  ? ?Por favor, tenga en cuenta que aunque hacemos todo lo posible para estar disponibles para asuntos urgentes fuera del horario de oficina, no estamos disponibles las 24 horas del d?a, los 7 d?as de la semana.  ? ?Si tiene un problema urgente y no puede comunicarse con nosotros, puede optar por buscar atenci?n m?dica  en el consultorio de su doctor(a), en una cl?nica privada, en un centro de atenci?n urgente o en una sala de emergencias. ? ?Si tiene Engineer, maintenance (IT) m?dica, por favor llame  inmediatamente al 911 o vaya a la sala de emergencias. ? ?N?meros de b?per ? ?- Dr. Nehemiah Massed: [phone] ? ?- Dra. Moye: [phone] ? ?- Dra. Nicole Kindred: [phone] ? ?En caso de inclemencias del tiempo, por favor llame a nuestra l?nea principal al [phone] para una actualizaci?n sobre el estado de cualquier retraso o cierre. ? ?Consejos para la medicaci?n en dermatolog?a: ?Por favor, guarde las cajas en las que vienen los medicamentos de uso t?pico para ayudarle a seguir las instrucciones sobre d?nde y c?mo usarlos. Las farmacias generalmente imprimen las instrucciones del medicamento s?lo en las cajas y no directamente en los tubos del [place].  ? ?Si su medicamento es muy caro, por favor, p?ngase en contacto con Zigmund Daniel llamando al [phone] y presione la opci?n 4 o env?enos un mensaje a trav?s de MyChart.  ? ?No podemos decirle cu?l ser? su copago por los medicamentos por adelantado ya que esto es diferente dependiendo de la [place]  seguro. Sin embargo, es posible que podamos encontrar un medicamento sustituto a Electrical engineer un formulario para que el seguro cubra el medicamento que se considera necesario.  ? ?Si se requiere Ardelia Mems autorizaci?n previa para que su compa??a de seguros [place] su medicamento, por favor perm?tanos de 1 a 2 d?as h?biles para completar este proceso. ? ?Los precios de los medicamentos var?an con frecuencia dependiendo del Environmental consultant de d?nde se surte la receta y alguna farmacias pueden ofrecer precios m?s baratos. ? ?El sitio web www.goodrx.com tiene cupones para medicamentos de Airline pilot. Los precios aqu? no tienen en cuenta lo que podr?a costar con la ayuda del seguro (puede ser m?s barato con su seguro), pero el sitio web puede darle el precio si no utiliz? ning?n seguro.  ?- Puede imprimir el cup?n correspondiente y llevarlo con su receta a la farmacia.  ?- Tambi?n puede pasar por nuestra oficina durante el horario de atenci?n regular y recoger  una tarjeta de cupones de GoodRx.  ?- Si necesita que su receta se env?e electr?nicamente a Chiropodist, informe a nuestra oficina a trav?s de MyChart de [place] o por tel?fono llamando al [phone] y p

## 2021-08-29 ENCOUNTER — Telehealth: Payer: Self-pay

## 2021-08-29 ENCOUNTER — Encounter: Payer: Self-pay | Admitting: Gastroenterology

## 2021-08-29 ENCOUNTER — Other Ambulatory Visit: Payer: Self-pay

## 2021-08-29 NOTE — Telephone Encounter (Signed)
-----   Message from Brendolyn Patty, MD sent at 08/26/2021  2:29 PM EDT ----- ?1. Skin , right lower flank ?SUPERFICIAL AND NODULAR BASAL CELL CARCINOMA ?2. Skin , right posterior lower neck ?SUPERFICIAL AND NODULAR BASAL CELL CARCINOMA, ULCERATED ?3. Skin , spinal mid back medial within scar ?BASAL CELL CARCINOMA, NODULAR PATTERN ?4. Skin , spinal mid back lateral within scar ?BASAL CELL CARCINOMA, NODULAR PATTERN ?5. 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 ?1 and 2. BCC skin cancer- already treated with EDC at time of biopsy ?3 and 4. BCC skin cancer- already treated with EDC at time of biopsy.  Since these are recurrent, they have a higher risk of coming back.  If so, would need excision. ?5. BCC and SCCIS skin cancers- already treated with EDC at time of biopsy ? ?  - please call patient ?

## 2021-08-29 NOTE — Telephone Encounter (Signed)
Advised pt of bx results/sh ?

## 2021-08-31 ENCOUNTER — Ambulatory Visit (INDEPENDENT_AMBULATORY_CARE_PROVIDER_SITE_OTHER): Payer: 59 | Admitting: Dermatology

## 2021-08-31 ENCOUNTER — Encounter: Payer: Self-pay | Admitting: Gastroenterology

## 2021-08-31 ENCOUNTER — Encounter: Payer: Self-pay | Admitting: Dermatology

## 2021-08-31 DIAGNOSIS — L820 Inflamed seborrheic keratosis: Secondary | ICD-10-CM | POA: Diagnosis not present

## 2021-08-31 NOTE — Patient Instructions (Signed)
Cryotherapy Aftercare ? ?Wash gently with soap and water everyday.   ?Apply Vaseline daily until healed.  ? ?Prior to procedure, discussed risks of blister formation, small wound, skin dyspigmentation, or rare scar following cryotherapy. Recommend Vaseline ointment to treated areas while healing.  ? ? ?If You Need Anything After Your Visit ? ?If you have any questions or concerns for your doctor, please call our main line at [phone] and press option 4 to reach your doctor's medical assistant. If no one answers, please leave a voicemail as directed and we will return your call as soon as possible. Messages left after 4 pm will be answered the following business day.  ? ?You may also send [place] a message via MyChart. We typically respond to MyChart messages within 1-2 business days. ? ?For prescription refills, please ask your pharmacy to contact our office. Our fax number is [phone]. ? ?If you have an urgent issue when the clinic is closed that cannot wait until the next business day, you can page your doctor at the number below.   ? ?Please note that while we do our best to be available for urgent issues outside of office hours, we are not available 24/7.  ? ?If you have an urgent issue and are unable to reach [place], you may choose to seek medical care at your doctor's office, retail clinic, urgent care center, or emergency room. ? ?If you have a medical emergency, please immediately call 911 or go to the emergency department. ? ?Pager Numbers ? ?- Dr. Nehemiah Massed: [phone] ? ?- Dr. Laurence Ferrari: [phone] ? ?- Dr. Nicole Kindred: [phone] ? ?In the event of inclement weather, please call our main line at [phone] for an update on the status of any delays or closures. ? ?Dermatology Medication Tips: ?Please keep the boxes that topical medications come in in order to help keep track of the instructions about where and how to use these. Pharmacies typically print the medication instructions only on the boxes and not  directly on the medication tubes.  ? ?If your medication is too expensive, please contact our office at [phone] option 4 or send [place] a message through [place].  ? ?We are unable to tell what your co-pay for medications will be in advance as this is different depending on your insurance coverage. However, we may be able to find a substitute medication at lower cost or fill out paperwork to get insurance to cover a needed medication.  ? ?If a prior authorization is required to get your medication covered by your insurance company, please allow [place] 1-2 business days to complete this process. ? ?Drug prices often vary depending on where the prescription is filled and some pharmacies may offer cheaper prices. ? ?The website www.goodrx.com contains coupons for medications through different pharmacies. The prices here do not account for what the cost may be with help from insurance (it may be cheaper with your insurance), but the website can give you the price if you did not use any insurance.  ?- You can print the associated coupon and take it with your prescription to the pharmacy.  ?- You may also stop by our office during regular business hours and pick up a GoodRx coupon card.  ?- If you need your prescription sent electronically to a different pharmacy, notify our office through [facility] or by phone at [phone] option 4. ? ? ? ? ?Si Usted Necesita Algo Despu?s de Su Visita ? ?Tambi?n puede enviarnos un mensaje a trav?s de  MyChart. Por lo general respondemos a los mensajes de MyChart en el transcurso de 1 a 2 d?as h?biles. ? ?Para renovar recetas, por favor pida a su farmacia que se ponga en contacto con nuestra oficina. Nuestro n?mero de fax es el [phone]. ? ?Si tiene un asunto urgente cuando la cl?nica est? cerrada y que no puede esperar hasta el siguiente d?a h?bil, puede llamar/localizar a su doctor(a) al n?mero que aparece a continuaci?n.  ? ?Por favor, tenga en cuenta que aunque hacemos  todo lo posible para estar disponibles para asuntos urgentes fuera del horario de oficina, no estamos disponibles las 24 horas del d?a, los 7 d?as de la semana.  ? ?Si tiene un problema urgente y no puede comunicarse con nosotros, puede optar por buscar atenci?n m?dica  en el consultorio de su doctor(a), en una cl?nica privada, en un centro de atenci?n urgente o en una sala de emergencias. ? ?Si tiene Engineer, maintenance (IT) m?dica, por favor llame inmediatamente al 911 o vaya a la sala de emergencias. ? ?N?meros de b?per ? ?- Dr. Nehemiah Massed: [phone] ? ?- Dra. Moye: [phone] ? ?- Dra. Nicole Kindred: [phone] ? ?En caso de inclemencias del tiempo, por favor llame a nuestra l?nea principal al [phone] para una actualizaci?n sobre el estado de cualquier retraso o cierre. ? ?Consejos para la medicaci?n en dermatolog?a: ?Por favor, guarde las cajas en las que vienen los medicamentos de uso t?pico para ayudarle a seguir las instrucciones sobre d?nde y c?mo usarlos. Las farmacias generalmente imprimen las instrucciones del medicamento s?lo en las cajas y no directamente en los tubos del [place].  ? ?Si su medicamento es muy caro, por favor, p?ngase en contacto con Zigmund Daniel llamando al [phone] y presione la opci?n 4 o env?enos un mensaje a trav?s de MyChart.  ? ?No podemos decirle cu?l ser? su copago por los medicamentos por adelantado ya que esto es diferente dependiendo de la [place] de su seguro. Sin embargo, es posible que podamos encontrar un medicamento sustituto a Electrical engineer un formulario para que el seguro cubra el medicamento que se considera necesario.  ? ?Si se requiere Ardelia Mems autorizaci?n previa para que su compa??a de seguros [place] su medicamento, por favor perm?tanos de 1 a 2 d?as h?biles para completar este proceso. ? ?Los precios de los medicamentos var?an con frecuencia dependiendo del Environmental consultant de d?nde se surte la receta y alguna farmacias pueden ofrecer precios m?s baratos. ? ?El  sitio web www.goodrx.com tiene cupones para medicamentos de Airline pilot. Los precios aqu? no tienen en cuenta lo que podr?a costar con la ayuda del seguro (puede ser m?s barato con su seguro), pero el sitio web puede darle el precio si no utiliz? ning?n seguro.  ?- Puede imprimir el cup?n correspondiente y llevarlo con su receta a la farmacia.  ?- Tambi?n puede pasar por nuestra oficina durante el horario de atenci?n regular y recoger una tarjeta de cupones de GoodRx.  ?- Si necesita que su receta se env?e electr?nicamente a Chiropodist, informe a nuestra oficina a trav?s de MyChart de [place] o por tel?fono llamando al [phone] y presione la opci?n 4.  ? ? ?

## 2021-08-31 NOTE — Progress Notes (Signed)
? ?  Follow-Up Visit ?  ?Subjective  ?Wayne Sanford is a 51 y.o. male who presents for the following: lesion (Check spot in groin area. Did not get checked when here last week. Dur: years. Has grown. Has been removed twice (cut off, per patient)).  He states was sent for biopsy in past and was not a wart. ? ?The patient has spots, moles and lesions to be evaluated, some may be new or changing and the patient has concerns that these could be cancer. ? ? ?The following portions of the chart were reviewed this encounter and updated as appropriate:   ?  ? ?Review of Systems: No other skin or systemic complaints except as noted in HPI or Assessment and Plan. ? ? ?Objective  ?Well appearing patient in no apparent distress; mood and affect are within normal limits. ? ?A focused examination was performed including groin. Relevant physical exam findings are noted in the Assessment and Plan. ? ?Right Penile Shaft x 1, Left Penile Shaft x 1 (2) ?4 mm pink flesh papule at right shaft. 1 cm verrucous flesh papule at left shaft ? ? ?Assessment & Plan  ?Inflamed seborrheic keratosis (2) ?Right Penile Shaft x 1, Left Penile Shaft x 1 ? ?Vs condyloma ? ?Destruction of lesion - Right Penile Shaft x 1, Left Penile Shaft x 1 ? ?Destruction method: cryotherapy   ?Informed consent: discussed and consent obtained   ?Lesion destroyed using liquid nitrogen: Yes   ?Region frozen until ice ball extended beyond lesion: Yes   ?Outcome: patient tolerated procedure well with no complications   ?Post-procedure details: wound care instructions given   ?Additional details:  Prior to procedure, discussed risks of blister formation, small wound, skin dyspigmentation, or rare scar following cryotherapy. Recommend Vaseline ointment to treated areas while healing.  ? ? ?Return for Follow Up As Scheduled. ? ?I, Emelia Salisbury, CMA, am acting as scribe for Brendolyn Patty, MD. ? ?Documentation: I have reviewed the above documentation for accuracy and  completeness, and I agree with the above. ? ?Brendolyn Patty MD  ?

## 2021-09-01 ENCOUNTER — Encounter
Admission: RE | Disposition: A | Discharge status: Home / Self Care | Payer: Self-pay | Source: Home / Self Care | Attending: Gastroenterology

## 2021-09-01 ENCOUNTER — Encounter: Payer: Self-pay | Admitting: Gastroenterology

## 2021-09-01 ENCOUNTER — Ambulatory Visit: Payer: 59 | Admitting: Certified Registered Nurse Anesthetist

## 2021-09-01 ENCOUNTER — Ambulatory Visit
Admission: RE | Admit: 2021-09-01 | Discharge: 2021-09-01 | Disposition: A | Discharge status: Home / Self Care | Payer: 59 | Attending: Gastroenterology | Admitting: Gastroenterology

## 2021-09-01 DIAGNOSIS — Z87891 Personal history of nicotine dependence: Secondary | ICD-10-CM | POA: Insufficient documentation

## 2021-09-01 DIAGNOSIS — F32A Depression, unspecified: Secondary | ICD-10-CM | POA: Insufficient documentation

## 2021-09-01 DIAGNOSIS — F419 Anxiety disorder, unspecified: Secondary | ICD-10-CM | POA: Diagnosis not present

## 2021-09-01 DIAGNOSIS — K5140 Inflammatory polyps of colon without complications: Secondary | ICD-10-CM | POA: Insufficient documentation

## 2021-09-01 DIAGNOSIS — K635 Polyp of colon: Secondary | ICD-10-CM | POA: Diagnosis not present

## 2021-09-01 DIAGNOSIS — Z1211 Encounter for screening for malignant neoplasm of colon: Secondary | ICD-10-CM | POA: Insufficient documentation

## 2021-09-01 DIAGNOSIS — Z85828 Personal history of other malignant neoplasm of skin: Secondary | ICD-10-CM | POA: Diagnosis not present

## 2021-09-01 DIAGNOSIS — D124 Benign neoplasm of descending colon: Secondary | ICD-10-CM | POA: Insufficient documentation

## 2021-09-01 DIAGNOSIS — D127 Benign neoplasm of rectosigmoid junction: Secondary | ICD-10-CM | POA: Diagnosis not present

## 2021-09-01 DIAGNOSIS — D125 Benign neoplasm of sigmoid colon: Secondary | ICD-10-CM | POA: Diagnosis not present

## 2021-09-01 HISTORY — PX: COLONOSCOPY WITH PROPOFOL: SHX5780

## 2021-09-01 SURGERY — COLONOSCOPY WITH PROPOFOL
Anesthesia: General

## 2021-09-01 MED ORDER — PROPOFOL 500 MG/50ML IV EMUL
INTRAVENOUS | Status: DC | PRN
  Administered 2021-09-01: 160 ug/kg/min via INTRAVENOUS

## 2021-09-01 MED ORDER — SODIUM CHLORIDE 0.9 % IV SOLN: INTRAVENOUS | Status: DC

## 2021-09-01 MED ORDER — PROPOFOL 10 MG/ML IV BOLUS
INTRAVENOUS | Status: DC | PRN
Start: 2021-09-01 — End: 2021-09-01
  Administered 2021-09-01: 30 mg via INTRAVENOUS
  Administered 2021-09-01: 80 mg via INTRAVENOUS

## 2021-09-01 NOTE — Anesthesia Procedure Notes (Signed)
Date/Time: 09/01/2021 8:24 AM Performed by: Demetrius Charity, CRNA Pre-anesthesia Checklist: Patient identified, Emergency Drugs available, Suction available, Patient being monitored and Timeout performed Patient Re-evaluated:Patient Re-evaluated prior to induction Oxygen Delivery Method: Nasal cannula Induction Type: IV induction Placement Confirmation: CO2 detector and positive ETCO2

## 2021-09-01 NOTE — Anesthesia Preprocedure Evaluation (Signed)
Anesthesia Evaluation  Patient identified by MRN, date of birth, ID band Patient awake    Reviewed: Allergy & Precautions, H&P , NPO status , Patient's Chart, lab work & pertinent test results, reviewed documented beta blocker date and time   History of Anesthesia Complications Negative for: history of anesthetic complications  Airway Mallampati: II  TM Distance: >3 FB Neck ROM: full    Dental  (+) Dental Advidsory Given, Caps, Teeth Intact   Pulmonary neg pulmonary ROS, former smoker,    Pulmonary exam normal breath sounds clear to auscultation       Cardiovascular Exercise Tolerance: Good negative cardio ROS Normal cardiovascular exam Rhythm:regular Rate:Normal     Neuro/Psych PSYCHIATRIC DISORDERS Anxiety Depression negative neurological ROS     GI/Hepatic negative GI ROS, Neg liver ROS,   Endo/Other  negative endocrine ROS  Renal/GU negative Renal ROS  negative genitourinary   Musculoskeletal   Abdominal   Peds  Hematology negative hematology ROS (+)   Anesthesia Other Findings Past Medical History: 09/19/2012: Basal cell carcinoma     Comment:  mid chest, nodular pattern 03/21/2013: Basal cell carcinoma     Comment:  right base of neck, nodular pattern 04/21/2019: Basal cell carcinoma     Comment:  right lower chest, nodular pattern. [facility] 04/21/2019 04/21/2019: Basal cell carcinoma     Comment:  right lower flank, nodular pattern. [facility] 04/21/2019 08/24/2021: Basal cell carcinoma     Comment:  right lower flank, EDC 08/24/2021: Basal cell carcinoma     Comment:  right posterior lower neck, EDC 08/24/2021: Basal cell carcinoma     Comment:  spinal mid back medial within scar, Recurrecnt EDC 08/24/2021: Basal cell carcinoma     Comment:  spinal mid back lateral within scar, recurrent, EDC 08/24/2021: Basal cell carcinoma     Comment:  left top of shoulder, EDC No date: Nonmelanoma skin cancer     Comment:   Basal and squamous cell--multiple ([facility]--f/u q30mo 03/21/2013: Squamous cell carcinoma of skin     Comment:  left clavicle. Hypertrophic SCCis   Reproductive/Obstetrics negative OB ROS                             Anesthesia Physical Anesthesia Plan  ASA: 2  Anesthesia Plan: General   Post-op Pain Management:    Induction: Intravenous  PONV Risk Score and Plan: 2 and Propofol infusion and TIVA  Airway Management Planned: Natural Airway and Nasal Cannula  Additional Equipment:   Intra-op Plan:   Post-operative Plan:   Informed Consent: I have reviewed the patients History and Physical, chart, labs and discussed the procedure including the risks, benefits and alternatives for the proposed anesthesia with the patient or authorized representative who has indicated his/her understanding and acceptance.     Dental Advisory Given  Plan Discussed with: Anesthesiologist, CRNA and Surgeon  Anesthesia Plan Comments:         Anesthesia Quick Evaluation

## 2021-09-01 NOTE — Op Note (Signed)
[facility] Patient Name: Wayne Sanford Procedure Date: 09/01/2021 8:19 AM MRN: [id number] Account #: [id number] Date of Birth: 1970-09-28 Admit Type: Outpatient Age: 51 Room: [facility] ENDO ROOM 3 Gender: Male Note Status: Finalized Instrument Name: Jasper Riling [id number] Procedure:             Colonoscopy Indications:           Screening for colorectal malignant neoplasm Providers:             Jonathon Bellows MD, MD Medicines:             Monitored Anesthesia Care Complications:         No immediate complications. Procedure:             Pre-Anesthesia Assessment:                        - Prior to the procedure, a History and Physical was                         performed, and patient medications, allergies and                         sensitivities were reviewed. The patient's tolerance                         of previous anesthesia was reviewed.                        - The risks and benefits of the procedure and the                         sedation options and risks were discussed with the                         patient. All questions were answered and informed                         consent was obtained.                        - ASA Grade Assessment: II - A patient with mild                         systemic disease.                        After obtaining informed consent, the colonoscope was                         passed under direct vision. Throughout the procedure,                         the patient's blood pressure, pulse, and oxygen                         saturations were monitored continuously. The                         Colonoscope was introduced through the anus and  advanced to the the cecum, identified by the                         appendiceal orifice. The colonoscopy was performed                         with ease. The patient tolerated the procedure well.                         The quality of the bowel preparation  was good. Findings:      The perianal and digital rectal examinations were normal.      Two sessile polyps were found in the descending colon and cecum. The       polyps were 6 to 8 mm in size. These polyps were removed with a cold       snare. Resection and retrieval were complete.      A 12 mm polyp was found in the descending colon. The polyp was       semi-pedunculated. The polyp was removed with a hot snare. Resection and       retrieval were complete.      Four sessile polyps were found in the rectum and sigmoid colon. The       polyps were 4 to 6 mm in size. These polyps were removed with a cold       snare. Resection and retrieval were complete.      The exam was otherwise without abnormality on direct and retroflexion       views. Impression:            - Two 6 to 8 mm polyps in the descending colon and in                         the cecum, removed with a cold snare. Resected and                         retrieved.                        - One 12 mm polyp in the descending colon, removed                         with a hot snare. Resected and retrieved.                        - Four 4 to 6 mm polyps in the rectum and in the                         sigmoid colon, removed with a cold snare. Resected and                         retrieved.                        - The examination was otherwise normal on direct and                         retroflexion views. Recommendation:        - Discharge patient to home (with escort).                        -  Resume previous diet.                        - Continue present medications.                        - Await pathology results.                        - Repeat colonoscopy in 3 years for surveillance. Procedure Code(s):     --- Professional ---                        [phone], Colonoscopy, flexible; with removal of                         tumor(s), polyp(s), or other lesion(s) by snare                         technique Diagnosis Code(s):      --- Professional ---                        Z12.11, Encounter for screening for malignant neoplasm                         of colon                        K62.1, Rectal polyp                        K63.5, Polyp of colon CPT copyright 2019 American Medical Association. All rights reserved. The codes documented in this report are preliminary and upon coder review may  be revised to meet current compliance requirements. Jonathon Bellows, MD Jonathon Bellows MD, MD 09/01/2021 8:45:28 AM This report has been signed electronically. Number of Addenda: 0 Note Initiated On: 09/01/2021 8:19 AM Scope Withdrawal Time: 0 hours 16 minutes 13 seconds  Total Procedure Duration: 0 hours 18 minutes 32 seconds  Estimated Blood Loss:  Estimated blood loss: none.      [facility]

## 2021-09-01 NOTE — Transfer of Care (Signed)
Immediate Anesthesia Transfer of Care Note  Patient: Wayne Sanford  Procedure(s) Performed: COLONOSCOPY WITH PROPOFOL  Patient Location: PACU  Anesthesia Type:General  Level of Consciousness: drowsy  Airway & Oxygen Therapy: Patient Spontanous Breathing  Post-op Assessment: Report given to RN and Post -op Vital signs reviewed and stable  Post vital signs: Reviewed and stable  Last Vitals:  Vitals Value Taken Time  BP 98/59   Temp    Pulse    Resp    SpO2 96     Last Pain:  Vitals:   09/01/21 0720  TempSrc: Temporal  PainSc: 0-No pain         Complications: No notable events documented.

## 2021-09-01 NOTE — H&P (Signed)
Wayne Bellows, MD [address] [place], [place], [place], [address] [address], [place], [place], [place], [address] Phone: [phone]  Fax: [phone]  Primary Care Physician:  Burnard Hawthorne, FNP   Pre-Procedure History & Physical: HPI:  Wayne Sanford is a 51 y.o. male is here for an colonoscopy.   Past Medical History:  Diagnosis Date   Basal cell carcinoma 09/19/2012   mid chest, nodular pattern   Basal cell carcinoma 03/21/2013   right base of neck, nodular pattern   Basal cell carcinoma 04/21/2019   right lower chest, nodular pattern. [facility] 04/21/2019   Basal cell carcinoma 04/21/2019   right lower flank, nodular pattern. [facility] 04/21/2019   Basal cell carcinoma 08/24/2021   right lower flank, EDC   Basal cell carcinoma 08/24/2021   right posterior lower neck, EDC   Basal cell carcinoma 08/24/2021   spinal mid back medial within scar, Recurrecnt EDC   Basal cell carcinoma 08/24/2021   spinal mid back lateral within scar, recurrent, EDC   Basal cell carcinoma 08/24/2021   left top of shoulder, EDC   Nonmelanoma skin cancer    Basal and squamous cell--multiple ([facility]--f/u q35mo   Squamous cell carcinoma of skin 03/21/2013   left clavicle. Hypertrophic SCCis    Past Surgical History:  Procedure Laterality Date   CHOLECYSTECTOMY  04/18/2007   MOHS SURGERY      Prior to Admission medications   Not on File    Allergies as of 08/09/2021   (No Known Allergies)    Family History  Adopted: Yes    Social History   Socioeconomic History   Marital status: Single    Spouse name: Not on file   Number of children: Not on file   Years of education: Not on file   Highest education level: Not on file  Occupational History   Not on file  Tobacco Use   Smoking status: Former    Packs/day: 1.00    Years: 20.00    Pack years: 20.00    Types: Cigarettes    Quit date: 04/26/2020    Years since quitting: 1.3   Smokeless tobacco:  Never  Vaping Use   Vaping Use: Every day  Substance and Sexual Activity   Alcohol use: No   Drug use: No   Sexual activity: Not on file  Other Topics Concern   Not on file  Social History Narrative   [place]        [place] (18[place], [email or web address]Occ: [employer], works from home.       .Tob: 20 pack-yr hx --current as of 11/2013.No alcohol  No drugs.Enjoys golf, boating.   Social Determinants of Health   Financial Resource Strain: Not on file  Food Insecurity: Not on file  Transportation Needs: Not on file  Physical Activity: Not on file  Stress: Not on file  Social Connections: Not on file  Intimate Partner Violence: Not on file    Review of Systems: See HPI, otherwise negative ROS  Physical Exam: BP 114/78   Pulse 80   Temp (!) 97 F (36.1 C) (Temporal)   Resp 18   Ht '5\' 11"'$  (1.803 m)   Wt 99.8 kg   SpO2 99%   BMI 30.68 kg/m  General:   Alert,  pleasant and cooperative in NAD Head:  Normocephalic and atraumatic. Neck:  Supple; no masses or thyromegaly. Lungs:  Clear throughout to auscultation, normal respiratory effort.    Heart:  +S1, +S2, Regular rate and rhythm, No edema. Abdomen:  Soft, nontender and nondistended. Normal bowel sounds, without guarding, and without rebound.   Neurologic:  Alert and  oriented x4;  grossly normal neurologically.  Impression/Plan: [name] is here for an colonoscopy to be performed for Screening colonoscopy average risk   Risks, benefits, limitations, and alternatives regarding  colonoscopy have been reviewed with the patient.  Questions have been answered.  All parties agreeable.   Wayne Bellows, MD  09/01/2021, 8:18 AM

## 2021-09-02 ENCOUNTER — Encounter: Payer: Self-pay | Admitting: Gastroenterology

## 2021-09-02 LAB — SURGICAL PATHOLOGY

## 2021-09-02 NOTE — Anesthesia Postprocedure Evaluation (Signed)
Anesthesia Post Note  Patient: Wayne Sanford  Procedure(s) Performed: COLONOSCOPY WITH PROPOFOL  Patient location during evaluation: Endoscopy Anesthesia Type: General Level of consciousness: awake and alert Pain management: pain level controlled Vital Signs Assessment: post-procedure vital signs reviewed and stable Respiratory status: spontaneous breathing, nonlabored ventilation, respiratory function stable and patient connected to nasal cannula oxygen Cardiovascular status: blood pressure returned to baseline and stable Postop Assessment: no apparent nausea or vomiting Anesthetic complications: no   No notable events documented.   Last Vitals:  Vitals:   09/01/21 0856 09/01/21 0906  BP: 117/78 115/82  Pulse: 61 60  Resp: 16 15  Temp:    SpO2: 93% 97%    Last Pain:  Vitals:   09/02/21 0740  TempSrc:   PainSc: 0-No pain                 Martha Clan

## 2021-09-05 ENCOUNTER — Encounter: Payer: Self-pay | Admitting: Gastroenterology

## 2021-09-16 ENCOUNTER — Ambulatory Visit
Admission: RE | Admit: 2021-09-16 | Discharge: 2021-09-16 | Disposition: A | Discharge status: Home / Self Care | Payer: 59 | Source: Ambulatory Visit | Attending: Urology | Admitting: Urology

## 2021-09-16 DIAGNOSIS — R102 Pelvic and perineal pain: Secondary | ICD-10-CM | POA: Diagnosis not present

## 2021-09-19 ENCOUNTER — Encounter: Payer: Self-pay | Admitting: *Deleted

## 2021-10-04 ENCOUNTER — Ambulatory Visit (INDEPENDENT_AMBULATORY_CARE_PROVIDER_SITE_OTHER): Payer: 59 | Admitting: *Deleted

## 2021-10-04 DIAGNOSIS — Z23 Encounter for immunization: Secondary | ICD-10-CM

## 2021-10-04 NOTE — Progress Notes (Signed)
Patient presented for B 12 injection to left deltoid, patient voiced no concerns nor showed any signs of distress during injection. 

## 2022-01-10 ENCOUNTER — Ambulatory Visit (INDEPENDENT_AMBULATORY_CARE_PROVIDER_SITE_OTHER): Payer: 59 | Admitting: Dermatology

## 2022-01-10 DIAGNOSIS — L578 Other skin changes due to chronic exposure to nonionizing radiation: Secondary | ICD-10-CM | POA: Diagnosis not present

## 2022-01-10 DIAGNOSIS — D489 Neoplasm of uncertain behavior, unspecified: Secondary | ICD-10-CM | POA: Diagnosis not present

## 2022-01-10 DIAGNOSIS — L910 Hypertrophic scar: Secondary | ICD-10-CM

## 2022-01-10 DIAGNOSIS — L821 Other seborrheic keratosis: Secondary | ICD-10-CM

## 2022-01-10 DIAGNOSIS — D1801 Hemangioma of skin and subcutaneous tissue: Secondary | ICD-10-CM

## 2022-01-10 DIAGNOSIS — L570 Actinic keratosis: Secondary | ICD-10-CM

## 2022-01-10 DIAGNOSIS — D229 Melanocytic nevi, unspecified: Secondary | ICD-10-CM

## 2022-01-10 DIAGNOSIS — Z85828 Personal history of other malignant neoplasm of skin: Secondary | ICD-10-CM | POA: Diagnosis not present

## 2022-01-10 DIAGNOSIS — L814 Other melanin hyperpigmentation: Secondary | ICD-10-CM

## 2022-01-10 NOTE — Patient Instructions (Signed)
Biopsy Wound Care Instructions  Leave the original bandage on for 24 hours if possible.  If the bandage becomes soaked or soiled before that time, it is OK to remove it and examine the wound.  A small amount of post-operative bleeding is normal.  If excessive bleeding occurs, remove the bandage, place gauze over the site and apply continuous pressure (no peeking) over the area for 30 minutes. If this does not work, please call our clinic as soon as possible or page your doctor if it is after hours.   Once a day, cleanse the wound with soap and water. It is fine to shower. If a thick crust develops you may use a Q-tip dipped into dilute hydrogen peroxide (mix 1:1 with water) to dissolve it.  Hydrogen peroxide can slow the healing process, so use it only as needed.    After washing, apply petroleum jelly (Vaseline) or an antibiotic ointment if your doctor prescribed one for you, followed by a bandage.    For best healing, the wound should be covered with a layer of ointment at all times. If you are not able to keep the area covered with a bandage to hold the ointment in place, this may mean re-applying the ointment several times a day.  Continue this wound care until the wound has healed and is no longer open.   Itching and mild discomfort is normal during the healing process. However, if you develop pain or severe itching, please call our office.   If you have any discomfort, you can take Tylenol (acetaminophen) or ibuprofen as directed on the bottle. (Please do not take these if you have an allergy to them or cannot take them for another reason).  Some redness, tenderness and white or yellow material in the wound is normal healing.  If the area becomes very sore and red, or develops a thick yellow-green material (pus), it may be infected; please notify us.    If you have stitches, return to clinic as directed to have the stitches removed. You will continue wound care for 2-3 days after the stitches  are removed.   Wound healing continues for up to one year following surgery. It is not unusual to experience pain in the scar from time to time during the interval.  If the pain becomes severe or the scar thickens, you should notify the office.    A slight amount of redness in a scar is expected for the first six months.  After six months, the redness will fade and the scar will soften and fade.  The color difference becomes less noticeable with time.  If there are any problems, return for a post-op surgery check at your earliest convenience.  To improve the appearance of the scar, you can use silicone scar gel, cream, or sheets (such as Mederma or Serica) every night for up to one year. These are available over the counter (without a prescription).  Please call our office at [phone] for any questions or concerns.    Can start in January  - Start 5-fluorouracil/calcipotriene cream twice a day for 4 days to affected areas including face can also use at areas of scalp twice daily for 7 days . Prescription sent to [facility]. Patient advised they will receive an email to purchase the medication online and have it sent to their home. Patient provided with handout reviewing treatment course and side effects and advised to call or message [place] on MyChart with any concerns.  Instructions for [facility] Medications  One or more of your medications was sent to the [facility] mail order [facility] pharmacy. You will receive an email from them and can purchase the medicine through that link. It will then be mailed to your home at the address you confirmed. If for any reason you do not receive an email from them, please check your spam folder. If you still do not find the email, please let us know. [facility] phone number is [phone].     5-Fluorouracil/Calcipotriene Patient Education   Actinic keratoses are the dry, red scaly spots on the skin caused by  sun damage. A portion of these spots can turn into skin cancer with time, and treating them can help prevent development of skin cancer.   Treatment of these spots requires removal of the defective skin cells. There are various ways to remove actinic keratoses, including freezing with liquid nitrogen, treatment with creams, or treatment with a blue light procedure in the office.   5-fluorouracil cream is a topical cream used to treat actinic keratoses. It works by interfering with the growth of abnormal fast-growing skin cells, such as actinic keratoses. These cells peel off and are replaced by healthy ones.   5-fluorouracil/calcipotriene is a combination of the 5-fluorouracil cream with a vitamin D analog cream called calcipotriene. The calcipotriene alone does not treat actinic keratoses. However, when it is combined with 5-fluorouracil, it helps the 5-fluorouracil treat the actinic keratoses much faster so that the same results can be achieved with a much shorter treatment time.  INSTRUCTIONS FOR 5-FLUOROURACIL/CALCIPOTRIENE CREAM:   5-fluorouracil/calcipotriene cream typically only needs to be used for 4-7 days. A thin layer should be applied twice a day to the treatment areas recommended by your physician.   If your physician prescribed you separate tubes of 5-fluourouracil and calcipotriene, apply a thin layer of 5-fluorouracil followed by a thin layer of calcipotriene.   Avoid contact with your eyes, nostrils, and mouth. Do not use 5-fluorouracil/calcipotriene cream on infected or open wounds.   You will develop redness, irritation and some crusting at areas where you have pre-cancer damage/actinic keratoses. IF YOU DEVELOP PAIN, BLEEDING, OR SIGNIFICANT CRUSTING, STOP THE TREATMENT EARLY - you have already gotten a good response and the actinic keratoses should clear up well.  Wash your hands after applying 5-fluorouracil 5% cream on your skin.   A moisturizer or sunscreen with a minimum  SPF 30 should be applied each morning.   Once you have finished the treatment, you can apply a thin layer of Vaseline twice a day to irritated areas to soothe and calm the areas more quickly. If you experience significant discomfort, contact your physician.  For some patients it is necessary to repeat the treatment for best results.  SIDE EFFECTS: When using 5-fluorouracil/calcipotriene cream, you may have mild irritation, such as redness, dryness, swelling, or a mild burning sensation. This usually resolves within 2 weeks. The more actinic keratoses you have, the more redness and inflammation you can expect during treatment. Eye irritation has been reported rarely. If this occurs, please let us know.  If you have any trouble using this cream, please call the office. If you have any other questions about this information, please do not hesitate to ask me before you leave the office.   Seborrheic Keratosis  What causes seborrheic keratoses? Seborrheic keratoses are harmless, common skin growths that first appear during adult life.  As time goes by, more growths appear.  Some people may  develop a large number of them.  Seborrheic keratoses appear on both covered and uncovered body parts.  They are not caused by sunlight.  The tendency to develop seborrheic keratoses can be inherited.  They vary in color from skin-colored to gray, Bown, or even black.  They can be either smooth or have a rough, warty surface.   Seborrheic keratoses are superficial and look as if they were stuck on the skin.  Under the microscope this type of keratosis looks like layers upon layers of skin.  That is why at times the top layer may seem to fall off, but the rest of the growth remains and re-grows.    Treatment Seborrheic keratoses do not need to be treated, but can easily be removed in the office.  Seborrheic keratoses often cause symptoms when they rub on clothing or jewelry.  Lesions can be in the way of shaving.  If  they become inflamed, they can cause itching, soreness, or burning.  Removal of a seborrheic keratosis can be accomplished by freezing, burning, or surgery. If any spot bleeds, scabs, or grows rapidly, please return to have it checked, as these can be an indication of a skin cancer.    Melanoma ABCDEs  Melanoma is the most dangerous type of skin cancer, and is the leading cause of death from skin disease.  You are more likely to develop melanoma if you: Have light-colored skin, light-colored eyes, or red or blond hair Spend a lot of time in the sun Tan regularly, either outdoors or in a tanning bed Have had blistering sunburns, especially during childhood Have a close family member who has had a melanoma Have atypical moles or large birthmarks  Early detection of melanoma is key since treatment is typically straightforward and cure rates are extremely high if we catch it early.   The first sign of melanoma is often a change in a mole or a new dark spot.  The ABCDE system is a way of remembering the signs of melanoma.  A for asymmetry:  The two halves do not match. B for border:  The edges of the growth are irregular. C for color:  A mixture of colors are present instead of an even Mirabile color. D for diameter:  Melanomas are usually (but not always) greater than 66m - the size of a pencil eraser. E for evolution:  The spot keeps changing in size, shape, and color.  Please check your skin once per month between visits. You can use a small mirror in front and a large mirror behind you to keep an eye on the back side or your body.   If you see any new or changing lesions before your next follow-up, please call to schedule a visit.  Please continue daily skin protection including broad spectrum sunscreen SPF 30+ to sun-exposed areas, reapplying every 2 hours as needed when you're outdoors.   Staying in the shade or wearing long sleeves, sun glasses (UVA+UVB protection) and wide brim hats  (4-inch brim around the entire circumference of the hat) are also recommended for sun protection.        Due to recent changes in healthcare laws, you may see results of your pathology and/or laboratory studies on MyChart before the doctors have had a chance to review them. We understand that in some cases there may be results that are confusing or concerning to you. Please understand that not all results are received at the same time and often the doctors may  need to interpret multiple results in order to provide you with the best plan of care or course of treatment. Therefore, we ask that you please give [place] 2 business days to thoroughly review all your results before contacting the office for clarification. Should we see a critical lab result, you will be contacted sooner.   If You Need Anything After Your Visit  If you have any questions or concerns for your doctor, please call our main line at [phone] and press option 4 to reach your doctor's medical assistant. If no one answers, please leave a voicemail as directed and we will return your call as soon as possible. Messages left after 4 pm will be answered the following business day.   You may also send [place] a message via [place]. We typically respond to MyChart messages within 1-2 business days.  For prescription refills, please ask your pharmacy to contact our office. Our fax number is [phone].  If you have an urgent issue when the clinic is closed that cannot wait until the next business day, you can page your doctor at the number below.    Please note that while we do our best to be available for urgent issues outside of office hours, we are not available 24/7.   If you have an urgent issue and are unable to reach [place], you may choose to seek medical care at your doctor's office, retail clinic, urgent care center, or emergency room.  If you have a medical emergency, please immediately call 911 or go to the emergency  department.  Pager Numbers  - Dr. Nehemiah Massed: [phone]  - Dr. Laurence Ferrari: [phone]  - Dr. Nicole Kindred: [phone]  In the event of inclement weather, please call our main line at [phone] for an update on the status of any delays or closures.  Dermatology Medication Tips: Please keep the boxes that topical medications come in in order to help keep track of the instructions about where and how to use these. Pharmacies typically print the medication instructions only on the boxes and not directly on the medication tubes.   If your medication is too expensive, please contact our office at [phone] option 4 or send [place] a message through [place].   We are unable to tell what your co-pay for medications will be in advance as this is different depending on your insurance coverage. However, we may be able to find a substitute medication at lower cost or fill out paperwork to get insurance to cover a needed medication.   If a prior authorization is required to get your medication covered by your insurance company, please allow [place] 1-2 business days to complete this process.  Drug prices often vary depending on where the prescription is filled and some pharmacies may offer cheaper prices.  The website www.goodrx.com contains coupons for medications through different pharmacies. The prices here do not account for what the cost may be with help from insurance (it may be cheaper with your insurance), but the website can give you the price if you did not use any insurance.  - You can print the associated coupon and take it with your prescription to the pharmacy.  - You may also stop by our office during regular business hours and pick up a GoodRx coupon card.  - If you need your prescription sent electronically to a different pharmacy, notify our office through [facility] or by phone at [phone] option 4.     Si Usted Necesita Algo Despus de Su  Visita  Tambin puede enviarnos un  mensaje a travs de MyChart. Por lo general respondemos a los mensajes de MyChart en el transcurso de 1 a 2 das hbiles.  Para renovar recetas, por favor pida a su farmacia que se ponga en contacto con nuestra oficina. Harland Dingwall de fax es [place] [phone].  Si tiene un asunto urgente cuando la clnica est cerrada y que no puede esperar hasta el siguiente da hbil, puede llamar/localizar a su doctor(a) al nmero que aparece a continuacin.   Por favor, tenga en cuenta que aunque hacemos todo lo posible para estar disponibles para asuntos urgentes fuera del horario de [place], no estamos disponibles las 24 horas del da, los 7 das de la [place].   Si tiene un problema urgente y no puede comunicarse con nosotros, puede optar por buscar atencin mdica  en el consultorio de su doctor(a), en una clnica privada, en un centro de atencin urgente o en una sala de emergencias.  Si tiene Engineering geologist, por favor llame inmediatamente al 911 o vaya a la sala de emergencias.  Nmeros de bper  - Dr. Nehemiah Massed: [phone]  - Dra. Moye: [phone]  - Dra. Nicole Kindred: [phone]  En caso de inclemencias del [place], por favor llame a Johnsie Kindred principal al [phone] para una actualizacin sobre el [place] de cualquier retraso o cierre.  Consejos para la medicacin en dermatologa: Por favor, guarde las cajas en las que vienen los medicamentos de uso tpico para ayudarle a seguir las instrucciones sobre dnde y cmo usarlos. Las farmacias generalmente imprimen las instrucciones del medicamento slo en las cajas y no directamente en los tubos del [place].   Si su medicamento es muy caro, por favor, pngase en contacto con Zigmund Daniel llamando al [phone] y presione la opcin 4 o envenos un mensaje a travs de Pharmacist, community.   No podemos decirle cul ser su copago por los medicamentos por adelantado ya que esto es diferente dependiendo de la cobertura de su seguro. Sin embargo,  es posible que podamos encontrar un medicamento sustituto a Electrical engineer un formulario para que el seguro cubra el medicamento que se considera necesario.   Si se requiere una autorizacin previa para que su compaa de seguros [place] su medicamento, por favor permtanos de 1 a 2 das hbiles para completar este proceso.  Los precios de los medicamentos varan con frecuencia dependiendo del Environmental consultant de dnde se surte la receta y alguna farmacias pueden ofrecer precios ms baratos.  El sitio web www.goodrx.com tiene cupones para medicamentos de Airline pilot. Los precios aqu no tienen en cuenta lo que podra costar con la ayuda del seguro (puede ser ms barato con su seguro), pero el sitio web puede darle el precio si no utiliz Research scientist (physical sciences).  - Puede imprimir el cupn correspondiente y llevarlo con su receta a la farmacia.  - Tambin puede pasar por nuestra oficina durante el horario de atencin regular y Charity fundraiser una tarjeta de cupones de GoodRx.  - Si necesita que su receta se enve electrnicamente a una farmacia diferente, informe a nuestra oficina a travs de MyChart de [place] o por telfono llamando al [phone] y presione la opcin 4.

## 2022-01-10 NOTE — Progress Notes (Signed)
Follow-Up Visit   Subjective  Wayne Sanford is a 51 y.o. male who presents for the following: Actinic Keratosis (3 month follow up. Hx of aks at face and scalp. Reports a sore spot at right cheek he would like checked. Hx of recent bcc at several locations treated with [facility]. ). H/o of numerous BCCs  The patient has spots, moles and lesions to be evaluated, some may be new or changing and the patient has concerns that these could be cancer.   The following portions of the chart were reviewed this encounter and updated as appropriate:      Review of Systems: No other skin or systemic complaints except as noted in HPI or Assessment and Plan.   Objective  Well appearing patient in no apparent distress; mood and affect are within normal limits.  A focused examination was performed including face, scalp, ears, chest, back, arms, shoulder. Relevant physical exam findings are noted in the Assessment and Plan.  right lateral cheek 0.6 cm indistinct flesh papule with mild scale        right jaw x 1, right cheek x 1, left mandible x 2, right lower chest x 1, scalp x 3 (8) Erythematous thin papules/macules with gritty scale.   right chest 2 x 1 cm firm plaque   Assessment & Plan  Neoplasm of uncertain behavior right lateral cheek  Epidermal / dermal shaving  Lesion diameter (cm):  0.6 Informed consent: discussed and consent obtained   Patient was prepped and draped in usual sterile fashion: Area prepped with alcohol. Anesthesia: the lesion was anesthetized in a standard fashion   Anesthetic:  1% lidocaine w/ epinephrine 1-100,000 buffered w/ 8.4% NaHCO3 Instrument used: flexible razor blade   Hemostasis achieved with: pressure, aluminum chloride and electrodesiccation   Outcome: patient tolerated procedure well   Post-procedure details: wound care instructions given   Post-procedure details comment:  Ointment and small bandage applied.   Specimen 1 - Surgical  pathology Differential Diagnosis: Isk vs bcc vs scc  Check Margins: No  Isk vs bcc vs scc  Recommend excision if skin cancer   Actinic keratosis (8) right jaw x 1, right cheek x 1, left mandible x 2, right lower chest x 1, scalp x 3  Vs early bcc at right lower chest- inframammary fold  Recheck on f/up  Actinic keratoses are precancerous spots that appear secondary to cumulative UV radiation exposure/sun exposure over time. They are chronic with expected duration over 1 year. A portion of actinic keratoses will progress to squamous cell carcinoma of the skin. It is not possible to reliably predict which spots will progress to skin cancer and so treatment is recommended to prevent development of skin cancer.  Recommend daily broad spectrum sunscreen SPF 30+ to sun-exposed areas, reapply every 2 hours as needed.  Recommend staying in the shade or wearing long sleeves, sun glasses (UVA+UVB protection) and wide brim hats (4-inch brim around the entire circumference of the hat). Call for new or changing lesions.  Destruction of lesion - right jaw x 1, right cheek x 1, left mandible x 2, right lower chest x 1, scalp x 3  Destruction method: cryotherapy   Informed consent: discussed and consent obtained   Lesion destroyed using liquid nitrogen: Yes   Region frozen until ice ball extended beyond lesion: Yes   Outcome: patient tolerated procedure well with no complications   Post-procedure details: wound care instructions given   Additional details:  Prior to procedure, discussed  risks of blister formation, small wound, skin dyspigmentation, or rare scar following cryotherapy. Recommend Vaseline ointment to treated areas while healing.   Hypertrophic scar right chest  Benign, observe.    Lentigines - Scattered tan macules - Due to sun exposure - Benign-appearing, observe - Recommend daily broad spectrum sunscreen SPF 30+ to sun-exposed areas, reapply every 2 hours as needed. - Call  for any changes  Seborrheic Keratoses - Stuck-on, waxy, tan-Voges papules and/or plaques  - Benign-appearing - Discussed benign etiology and prognosis. - Observe - Call for any changes  Melanocytic Nevi - Tan-Schriner and/or pink-flesh-colored symmetric macules and papules - Benign appearing on exam today - Observation - Call clinic for new or changing moles - Recommend daily use of broad spectrum spf 30+ sunscreen to sun-exposed areas.   Hemangiomas - Red papules - Discussed benign nature - Observe - Call for any changes  Actinic Damage - Severe, confluent actinic changes with pre-cancerous actinic keratoses  - Severe, chronic, not at goal, secondary to cumulative UV radiation exposure over time - diffuse scaly erythematous macules and papules with underlying dyspigmentation - Discussed Prescription "Field Treatment" for Severe, Chronic Confluent Actinic Changes with Pre-Cancerous Actinic Keratoses Field treatment involves treatment of an entire area of skin that has confluent Actinic Changes (Sun/ Ultraviolet light damage) and PreCancerous Actinic Keratoses by method of PhotoDynamic Therapy (PDT) and/or prescription Topical Chemotherapy agents such as 5-fluorouracil, 5-fluorouracil/calcipotriene, and/or imiquimod.  The purpose is to decrease the number of clinically evident and subclinical PreCancerous lesions to prevent progression to development of skin cancer by chemically destroying early precancer changes that may or may not be visible.  It has been shown to reduce the risk of developing skin cancer in the treated area. As a result of treatment, redness, scaling, crusting, and open sores may occur during treatment course. One or more than one of these methods may be used and may have to be used several times to control, suppress and eliminate the PreCancerous changes. Discussed treatment course, expected reaction, and possible side effects. - Recommend daily broad spectrum sunscreen  SPF 30+ to sun-exposed areas, reapply every 2 hours as needed.  - Staying in the shade or wearing long sleeves, sun glasses (UVA+UVB protection) and wide brim hats (4-inch brim around the entire circumference of the hat) are also recommended. - Call for new or changing lesions. Can start cream in January  - Start 5-fluorouracil/calcipotriene cream twice a day for 4 days to affected areas including face and twice a day for 7 days to areas at scalp. Prescription sent to [facility]. Patient advised they will receive an email to purchase the medication online and have it sent to their home. Patient provided with handout reviewing treatment course and side effects and advised to call or message [place] on MyChart with any concerns.  History of Basal Cell Carcinoma of the Skin - No evidence of recurrence today, multiple sites, see history - Recommend regular full body skin exams - Recommend daily broad spectrum sunscreen SPF 30+ to sun-exposed areas, reapply every 2 hours as needed.  - Call if any new or changing lesions are noted between office visits   Return in about 5 months (around 06/12/2022) for ak follow up.  I, Ruthell Rummage, CMA, am acting as scribe for Brendolyn Patty, MD.  Documentation: I have reviewed the above documentation for accuracy and completeness, and I agree with the above.  Brendolyn Patty MD

## 2022-01-12 ENCOUNTER — Telehealth: Payer: Self-pay

## 2022-01-12 NOTE — Telephone Encounter (Signed)
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

## 2022-01-12 NOTE — Telephone Encounter (Signed)
Lft pt msg to call for bx result./sh 

## 2022-01-13 ENCOUNTER — Other Ambulatory Visit: Payer: Self-pay

## 2022-01-13 ENCOUNTER — Telehealth: Payer: Self-pay | Admitting: Family

## 2022-01-13 DIAGNOSIS — Z122 Encounter for screening for malignant neoplasm of respiratory organs: Secondary | ICD-10-CM

## 2022-01-13 DIAGNOSIS — Z87891 Personal history of nicotine dependence: Secondary | ICD-10-CM

## 2022-01-13 DIAGNOSIS — F17210 Nicotine dependence, cigarettes, uncomplicated: Secondary | ICD-10-CM

## 2022-01-13 NOTE — Telephone Encounter (Signed)
Lft pt vm to call [facility] to sch. thanks

## 2022-01-16 ENCOUNTER — Telehealth: Payer: Self-pay

## 2022-01-16 NOTE — Telephone Encounter (Signed)
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

## 2022-01-16 NOTE — Telephone Encounter (Signed)
Left pt msg to call for bx results/sh 

## 2022-01-19 NOTE — Telephone Encounter (Signed)
Advised pt of bx results.  Scheduled pt for excision 02/06/22 at 1:30./sh

## 2022-02-01 ENCOUNTER — Ambulatory Visit (INDEPENDENT_AMBULATORY_CARE_PROVIDER_SITE_OTHER): Payer: 59 | Admitting: Acute Care

## 2022-02-01 ENCOUNTER — Encounter: Payer: Self-pay | Admitting: Acute Care

## 2022-02-01 DIAGNOSIS — Z87891 Personal history of nicotine dependence: Secondary | ICD-10-CM | POA: Diagnosis not present

## 2022-02-01 NOTE — Progress Notes (Signed)
Virtual Visit via Telephone Note  I connected with Wayne Sanford on 02/01/22 at 10:30 AM EDT by telephone and verified that I am speaking with the correct person using two identifiers.  Location: Patient:  At home Provider:  [address], [place], [place], [address]    I discussed the limitations, risks, security and privacy concerns of performing an evaluation and management service by telephone and the availability of in person appointments. I also discussed with the patient that there may be a patient responsible charge related to this service. The patient expressed understanding and agreed to proceed.   Shared Decision Making Visit Lung Cancer Screening Program [phone])   Eligibility: Age 51 y.o. Pack Years Smoking History Calculation 34 pack year smoking history (# packs/per year x # years smoked) Recent History of coughing up blood  no Unexplained weight loss? no ( >Than 15 pounds within the last 6 months ) Prior History Lung / other cancer no (Diagnosis within the last 5 years already requiring surveillance chest CT Scans). Smoking Status Former Smoker Former Smokers: Years since quit: 1 year  Quit Date: 04/26/2020  Visit Components: Discussion included one or more decision making aids. yes Discussion included risk/benefits of screening. yes Discussion included potential follow up diagnostic testing for abnormal scans. yes Discussion included meaning and risk of over diagnosis. yes Discussion included meaning and risk of False Positives. yes Discussion included meaning of total radiation exposure. yes  Counseling Included: Importance of adherence to annual lung cancer LDCT screening. yes Impact of comorbidities on ability to participate in the program. yes Ability and willingness to under diagnostic treatment. yes  Smoking Cessation Counseling: Current Smokers:  Discussed importance of smoking cessation. yes Information about tobacco cessation classes and  interventions provided to patient. yes Patient provided with "ticket" for LDCT Scan. yes Symptomatic Patient. no  Counseling NA Diagnosis Code: Tobacco Use Z72.0 Asymptomatic Patient yes  Counseling (Intermediate counseling: > three minutes counseling) I9485 Former Smokers:  Discussed the importance of maintaining cigarette abstinence. yes Diagnosis Code: Personal History of Nicotine Dependence. I62.703 Information about tobacco cessation classes and interventions provided to patient. Yes Patient provided with "ticket" for LDCT Scan. yes Written Order for Lung Cancer Screening with LDCT placed in Epic. Yes (CT Chest Lung Cancer Screening Low Dose W/O CM) [id number] Z12.2-Screening of respiratory organs  I spent 25 minutes of face to face time/virtual visit time  with  Wayne Sanford discussing the risks and benefits of lung cancer screening. We took the time to pause the power point at intervals to allow for questions to be asked and answered to ensure understanding. We discussed that he had taken the single most powerful action possible to decrease his risk of developing lung cancer when he quit smoking. I counseled him to remain smoke free, and to contact me if he ever had the desire to smoke again so that I can provide resources and tools to help support the effort to remain smoke free. We discussed the time and location of the scan, and that either  Doroteo Glassman RN, Joella Prince, RN or I  or I will call / send a letter with the results within  24-72 hours of receiving them. He has the office contact information in the event he needs to speak with me,  He verbalized understanding of all of the above and had no further questions upon leaving the office.     I explained to the patient that there has been a high incidence of  coronary artery disease noted on these exams. I explained that this is a non-gated exam therefore degree or severity cannot be determined. This patient is not on statin therapy. I  have asked the patient to follow-up with their PCP regarding any incidental finding of coronary artery disease and management with diet or medication as they feel is clinically indicated. The patient verbalized understanding of the above and had no further questions.     Magdalen Spatz, NP 02/01/2022

## 2022-02-01 NOTE — Patient Instructions (Signed)
Thank you for participating in the [facility] Lung Cancer Screening Program. It was our pleasure to meet you today. We will call you with the results of your scan within the next few days. Your scan will be assigned a Lung RADS category score by the physicians reading the scans.  This Lung RADS score determines follow up scanning.  See below for description of categories, and follow up screening recommendations. We will be in touch to schedule your follow up screening annually or based on recommendations of our providers. We will fax a copy of your scan results to your Primary Care Physician, or the physician who referred you to the program, to ensure they have the results. Please call the office if you have any questions or concerns regarding your scanning experience or results.  Our office number is [phone]. Please speak with Denise Phelps, RN. , or  Denise Buckner RN, They are  our Lung Cancer Screening RN.'s If They are unavailable when you call, Please leave a message on the voice mail. We will return your call at our earliest convenience.This voice mail is monitored several times a day.  Remember, if your scan is normal, we will scan you annually as long as you continue to meet the criteria for the program. (Age 55-77, Current smoker or smoker who has quit within the last 15 years). If you are a smoker, remember, quitting is the single most powerful action that you can take to decrease your risk of lung cancer and other pulmonary, breathing related problems. We know quitting is hard, and we are here to help.  Please let us know if there is anything we can do to help you meet your goal of quitting. If you are a former smoker, congratulations. We are proud of you! Remain smoke free! Remember you can refer friends or family members through the number above.  We will screen them to make sure they meet criteria for the program. Thank you for helping us take better care of you by  participating in Lung Screening.  You can receive free nicotine replacement therapy ( patches, gum or mints) by calling 1-800-QUIT NOW. Please call so we can get you on the path to becoming  a non-smoker. I know it is hard, but you can do this!  Lung RADS Categories:  Lung RADS 1: no nodules or definitely non-concerning nodules.  Recommendation is for a repeat annual scan in 12 months.  Lung RADS 2:  nodules that are non-concerning in appearance and behavior with a very low likelihood of becoming an active cancer. Recommendation is for a repeat annual scan in 12 months.  Lung RADS 3: nodules that are probably non-concerning , includes nodules with a low likelihood of becoming an active cancer.  Recommendation is for a 6-month repeat screening scan. Often noted after an upper respiratory illness. We will be in touch to make sure you have no questions, and to schedule your 6-month scan.  Lung RADS 4 A: nodules with concerning findings, recommendation is most often for a follow up scan in 3 months or additional testing based on our provider's assessment of the scan. We will be in touch to make sure you have no questions and to schedule the recommended 3 month follow up scan.  Lung RADS 4 B:  indicates findings that are concerning. We will be in touch with you to schedule additional diagnostic testing based on our provider's  assessment of the scan.  Other options for assistance in smoking cessation (   As covered by your insurance benefits)  Hypnosis for smoking cessation  [employer] [phone]  Acupuncture for smoking cessation  [facility] [phone]   

## 2022-02-02 ENCOUNTER — Ambulatory Visit
Admission: RE | Admit: 2022-02-02 | Discharge: 2022-02-02 | Disposition: A | Discharge status: Home / Self Care | Payer: 59 | Source: Ambulatory Visit | Attending: Acute Care | Admitting: Acute Care

## 2022-02-02 DIAGNOSIS — Z122 Encounter for screening for malignant neoplasm of respiratory organs: Secondary | ICD-10-CM | POA: Insufficient documentation

## 2022-02-02 DIAGNOSIS — R911 Solitary pulmonary nodule: Secondary | ICD-10-CM | POA: Diagnosis not present

## 2022-02-02 DIAGNOSIS — Z87891 Personal history of nicotine dependence: Secondary | ICD-10-CM | POA: Insufficient documentation

## 2022-02-02 DIAGNOSIS — F17210 Nicotine dependence, cigarettes, uncomplicated: Secondary | ICD-10-CM

## 2022-02-03 ENCOUNTER — Telehealth: Payer: Self-pay | Admitting: Acute Care

## 2022-02-03 ENCOUNTER — Ambulatory Visit (INDEPENDENT_AMBULATORY_CARE_PROVIDER_SITE_OTHER): Payer: 59 | Admitting: Family

## 2022-02-03 ENCOUNTER — Encounter: Payer: Self-pay | Admitting: Family

## 2022-02-03 ENCOUNTER — Other Ambulatory Visit: Payer: Self-pay | Admitting: Acute Care

## 2022-02-03 VITALS — BP 118/70 | HR 66 | Temp 98.0°F | Ht 71.0 in | Wt 219.4 lb

## 2022-02-03 DIAGNOSIS — R911 Solitary pulmonary nodule: Secondary | ICD-10-CM

## 2022-02-03 DIAGNOSIS — Z23 Encounter for immunization: Secondary | ICD-10-CM

## 2022-02-03 DIAGNOSIS — E119 Type 2 diabetes mellitus without complications: Secondary | ICD-10-CM

## 2022-02-03 DIAGNOSIS — R7309 Other abnormal glucose: Secondary | ICD-10-CM

## 2022-02-03 DIAGNOSIS — R81 Glycosuria: Secondary | ICD-10-CM | POA: Diagnosis not present

## 2022-02-03 LAB — POCT GLYCOSYLATED HEMOGLOBIN (HGB A1C): Hemoglobin A1C: 7 % — AB (ref 4.0–5.6)

## 2022-02-03 LAB — COMPREHENSIVE METABOLIC PANEL
ALT: 30 U/L (ref 0–53)
AST: 22 U/L (ref 0–37)
Albumin: 4.4 g/dL (ref 3.5–5.2)
Alkaline Phosphatase: 80 U/L (ref 39–117)
BUN: 16 mg/dL (ref 6–23)
CO2: 27 mEq/L (ref 19–32)
Calcium: 9.8 mg/dL (ref 8.4–10.5)
Chloride: 103 mEq/L (ref 96–112)
Creatinine, Ser: 1.17 mg/dL (ref 0.40–1.50)
GFR: 71.98 mL/min (ref >60.00)
Glucose, Bld: 156 mg/dL — ABNORMAL HIGH (ref 70–99)
Potassium: 4.2 mEq/L (ref 3.5–5.1)
Sodium: 139 mEq/L (ref 135–145)
Total Bilirubin: 0.8 mg/dL (ref 0.2–1.2)
Total Protein: 7.1 g/dL (ref 6.0–8.3)

## 2022-02-03 LAB — MICROALBUMIN / CREATININE URINE RATIO
Creatinine,U: 226 mg/dL
Microalb Creat Ratio: 0.3 mg/g (ref 0.0–30.0)
Microalb, Ur: <0.7 mg/dL (ref 0.0–1.9)

## 2022-02-03 NOTE — Progress Notes (Signed)
Discussed during OV. Please see OV notes

## 2022-02-03 NOTE — Addendum Note (Signed)
Addended by: [place], Nathen Balaban on: 02/03/2022 09:57 AM   Modules accepted: Orders

## 2022-02-03 NOTE — Assessment & Plan Note (Signed)
Lab Results  Component Value Date   HGBA1C 7.0 (A) 02/03/2022   Discussed A1c was not at goal. Advised goal of A1c to be closer to 6.5.  Patient agreeable to stopping drinking sweet tea and soda.  Follow-up in 3 months time

## 2022-02-03 NOTE — Telephone Encounter (Signed)
I have called the patient with the results of his low-dose CT.  I explained that his scan was read as a lung RADS 4B.  I explained that there is a spiculated solid nodule of the left lower lobe that measures 15.3 mm in mean diameter.  Additionally there is an irregular pulmonary nodule measuring 8.6 mm in the right lower lobe, in addition to a 7.2 mm irregular pulmonary nodule in the left lower lobe.  I explained that we need to look more closely at these nodules with a PET scan.  We discussed the PET scan would help determine whether or not these nodules are growing and whether or not we needed to biopsy them. Patient is in agreement with a PET scan.  I explained that usually takes about a week to get them scheduled.  I have placed the order tonight and I told him he will most likely get a phone call Monday of next week to get this scheduled. He will need follow-up with either Dr. Patsey Berthold or Dr. Genia Harold in the [place] office once the scan has been completed to review the results.  Margie, can you please schedule patient with either Dr. Patsey Berthold or Dr.Dgayli once the PET has been scheduled for review of results and next steps . Thanks so much.

## 2022-02-03 NOTE — Progress Notes (Signed)
Subjective:    Patient ID: Wayne Sanford, male    DOB: 09/14/1970, 51 y.o.   MRN: [id number]  CC: Wayne Sanford is a 51 y.o. male who presents today for follow up.   HPI: Feels well today.  No new complaints He endorses dietary discretion with drinking sweet tea and soda daily.  He is not on medication for diabetes at this time.   Colonoscopy is up-to-date.  Repeat in 3 years time  CT chest obtained yesterday, results pending  HISTORY:  Past Medical History:  Diagnosis Date   Basal cell carcinoma 09/19/2012   mid chest, nodular pattern   Basal cell carcinoma 03/21/2013   right base of neck, nodular pattern   Basal cell carcinoma 04/21/2019   right lower chest, nodular pattern. [name] 04/21/2019   Basal cell carcinoma 04/21/2019   right lower flank, nodular pattern. [facility] 04/21/2019   Basal cell carcinoma 08/24/2021   right lower flank, [facility]   Basal cell carcinoma 08/24/2021   right posterior lower neck, [facility]   Basal cell carcinoma 08/24/2021   spinal mid back medial within scar, Recurrecnt [facility]   Basal cell carcinoma 08/24/2021   spinal mid back lateral within scar, recurrent, [facility]   Basal cell carcinoma 08/24/2021   left top of shoulder, [facility]   Nonmelanoma skin cancer    Basal and squamous cell--multiple ( skin care center--f/u q79mo   Squamous cell carcinoma of skin 03/21/2013   left clavicle. Hypertrophic SCCis   Squamous cell carcinoma of skin 01/10/2022   R lat cheek, Scheduled for excision 02/06/22   Past Surgical History:  Procedure Laterality Date   CHOLECYSTECTOMY  04/18/2007   COLONOSCOPY WITH PROPOFOL N/A 09/01/2021   Procedure: COLONOSCOPY WITH PROPOFOL;  Surgeon: AJonathon Bellows MD;  [facility];  Service: Gastroenterology;  Laterality: N/A;   MOHS SURGERY     Family History  Adopted: Yes    Allergies: Patient has no known allergies. No current outpatient medications on file prior to visit.   No current facility-administered medications on  file prior to visit.    Social History   Tobacco Use   Smoking status: Former    Packs/day: 1.00    Years: 34.00    Total pack years: 34.00    Types: Cigarettes    Quit date: 04/26/2020    Years since quitting: 1.7   Smokeless tobacco: Never  Vaping Use   Vaping Use: Every day  Substance Use Topics   Alcohol use: No   Drug use: No    Review of Systems  Constitutional:  Negative for chills and fever.  Respiratory:  Negative for cough.   Cardiovascular:  Negative for chest pain and palpitations.  Gastrointestinal:  Negative for nausea and vomiting.      Objective:    BP 118/70 (BP Location: Left Arm, Patient Position: Sitting, Cuff Size: Normal)   Pulse 66   Temp 98 F (36.7 C) (Oral)   Ht '5\' 11"'$  (1.803 m)   Wt 219 lb 6.4 oz (99.5 kg)   SpO2 96%   BMI 30.60 kg/m  BP Readings from Last 3 Encounters:  02/03/22 118/70  09/01/21 115/82  08/17/21 116/73   Wt Readings from Last 3 Encounters:  02/03/22 219 lb 6.4 oz (99.5 kg)  02/02/22 218 lb (98.9 kg)  09/01/21 220 lb (99.8 kg)    Physical Exam Vitals reviewed.  Constitutional:      Appearance: He is well-developed.  Cardiovascular:     Rate and Rhythm:  Regular rhythm.     Heart sounds: Normal heart sounds.  Pulmonary:     Effort: Pulmonary effort is normal. No respiratory distress.     Breath sounds: Normal breath sounds. No wheezing, rhonchi or rales.  Skin:    General: Skin is warm and dry.  Neurological:     Mental Status: He is alert.  Psychiatric:        Speech: Speech normal.        Behavior: Behavior normal.        Assessment & Plan:   Problem List Items Addressed This Visit       Endocrine   Diabetes mellitus without complication ([place])    Lab Results  Component Value Date   HGBA1C 7.0 (A) 02/03/2022  Discussed A1c was not at goal. Advised goal of A1c to be closer to 6.5.  Patient agreeable to stopping drinking sweet tea and soda.  Follow-up in 3 months time      Other Visit  Diagnoses     Elevated glucose    -  Primary   Relevant Orders   POCT HgB A1C (Completed)   Urine Microalbumin w/creat. ratio   Comprehensive metabolic panel   Glucose found in urine on examination       Relevant Orders   Urine Microalbumin w/creat. ratio        Wayne Sanford "Wayne Sanford" does not currently have medications on file.   No orders of the defined types were placed in this encounter.   Return precautions given.   Risks, benefits, and alternatives of the medications and treatment plan prescribed today were discussed, and patient expressed understanding.   Education regarding symptom management and diagnosis given to patient on AVS.  Continue to follow with Burnard Hawthorne, FNP for routine health maintenance.   Wayne Sanford and I agreed with plan.   [name], FNP

## 2022-02-03 NOTE — Progress Notes (Signed)
See telephone note dated 02/03/2022

## 2022-02-03 NOTE — Telephone Encounter (Signed)
I have attempted to call the patient with the results of their low dose CT. There was no answer. I left a HIPPA compliant message on their VM with the office contact number requesting that they call [phone] to review the results of the scan.If we do not hear from him by 5 pm today, we will call him again Monday.

## 2022-02-03 NOTE — Patient Instructions (Signed)
As discussed, please eliminate sweet tea and soda from your diet.  I really suspect we can reach an A1c of 6.5 if you were to do this. Nice seeing you today!

## 2022-02-06 ENCOUNTER — Ambulatory Visit (INDEPENDENT_AMBULATORY_CARE_PROVIDER_SITE_OTHER): Payer: 59 | Admitting: Dermatology

## 2022-02-06 ENCOUNTER — Encounter: Payer: Self-pay | Admitting: Dermatology

## 2022-02-06 ENCOUNTER — Telehealth: Payer: Self-pay | Admitting: Acute Care

## 2022-02-06 DIAGNOSIS — C44329 Squamous cell carcinoma of skin of other parts of face: Secondary | ICD-10-CM | POA: Diagnosis not present

## 2022-02-06 DIAGNOSIS — C4492 Squamous cell carcinoma of skin, unspecified: Secondary | ICD-10-CM

## 2022-02-06 NOTE — Telephone Encounter (Signed)
Attempted to inform pt about PET scan 11/6 and provide prep instructions   If pt calls back and I'm unavail   PREP instructions: - nothing to eat or drink other than water 6 hours prior   - NO gum, candy, sugar day of   - Low carb and low sugar diet the day before   - Arrive at 8am for 8:30am appt  I will be avail at [phone] if pt would like to speak with me directly. Thanks

## 2022-02-06 NOTE — Telephone Encounter (Signed)
Lm x1 for patient to schedule appt after 02/20/2022.

## 2022-02-06 NOTE — Progress Notes (Signed)
   Follow-Up Visit   Subjective  Wayne Sanford is a 51 y.o. male who presents for the following: SCC bx proven (R lat cheek, pt presents for excision today).   The following portions of the chart were reviewed this encounter and updated as appropriate:       Review of Systems:  No other skin or systemic complaints except as noted in HPI or Assessment and Plan.  Objective  Well appearing patient in no apparent distress; mood and affect are within normal limits.  A focused examination was performed including right cheek. Relevant physical exam findings are noted in the Assessment and Plan.  R lat cheek Pink bx site 1.5 x 1.0cm    Assessment & Plan  Squamous cell carcinoma of skin R lat cheek  Skin excision  Lesion length (cm):  1.5 Lesion width (cm):  1 Margin per side (cm):  0.2 Total excision diameter (cm):  1.9 Informed consent: discussed and consent obtained   Timeout: patient name, date of birth, surgical site, and procedure verified   Procedure prep:  Patient was prepped and draped in usual sterile fashion Prep type:  Povidone-iodine Anesthesia: the lesion was anesthetized in a standard fashion   Anesthetic:  1% lidocaine w/ epinephrine 1-100,000 buffered w/ 8.4% NaHCO3 (6cc lido with epi, 3cc bupivicaine, Total of 9cc) Instrument used comment:  #15c blade Hemostasis achieved with: pressure and electrodesiccation   Outcome: patient tolerated procedure well with no complications   Additional details:  Tag at superior 12:00 tip.  Skin repair Complexity:  Complex Final length (cm):  4.2 Informed consent: discussed and consent obtained   Timeout: patient name, date of birth, surgical site, and procedure verified   Reason for type of repair: reduce tension to allow closure, reduce the risk of dehiscence, infection, and necrosis, reduce subcutaneous dead space and avoid a hematoma, allow closure of the large defect, preserve normal anatomical and functional  relationships and enhance both functionality and cosmetic results   Undermining: area extensively undermined   Undermining comment:  1.9 cm Subcutaneous layers (deep stitches):  Suture size:  5-0 Suture type: Vicryl (polyglactin 910)   Stitches:  Buried vertical mattress Fine/surface layer approximation (top stitches):  Suture size:  5-0 Suture type: nylon   Suture type comment:  Nylon Stitches: simple interrupted   Suture removal (days):  7 Hemostasis achieved with: suture Outcome: patient tolerated procedure well with no complications   Post-procedure details: sterile dressing applied and wound care instructions given   Dressing type: pressure dressing (Mupirocin)    Specimen 1 - Surgical pathology Differential Diagnosis: Bx proven SCC  Check Margins: yes Pink bx site 1.5 x 1.0cm [id number] Tag at superior 12:00 tip  Bx proven, excised today   Return in about 1 week (around 02/13/2022) for suture removal.  I, Sonya Hupman, RMA, am acting as scribe for Brendolyn Patty, MD .  Documentation: I have reviewed the above documentation for accuracy and completeness, and I agree with the above.  Brendolyn Patty MD

## 2022-02-06 NOTE — Telephone Encounter (Signed)
Ct results faxed to PCP with Follow up plans for PET scan included.

## 2022-02-06 NOTE — Patient Instructions (Signed)

## 2022-02-06 NOTE — Telephone Encounter (Signed)
Will schedule OV once PET has been scheduled.  

## 2022-02-07 ENCOUNTER — Telehealth: Payer: Self-pay

## 2022-02-07 NOTE — Telephone Encounter (Signed)
Left pt msg to call if any problems after yesterdays appointment./sh

## 2022-02-07 NOTE — Telephone Encounter (Signed)
Spoke to patient and scheduled appt 02/23/2022 at 9:00. Direction provided and patient voiced understanding.  Nothing further needed.

## 2022-02-10 ENCOUNTER — Telehealth: Payer: Self-pay | Admitting: Acute Care

## 2022-02-10 NOTE — Telephone Encounter (Signed)
Left pt vm to call back   Attempted to reach him to notify him on apt details, Pt has PET scan on 11/6  Message to front desk:   If pt calls back you can transfer to me at [phone]   Thanks

## 2022-02-13 ENCOUNTER — Ambulatory Visit (INDEPENDENT_AMBULATORY_CARE_PROVIDER_SITE_OTHER): Payer: 59 | Admitting: Dermatology

## 2022-02-13 DIAGNOSIS — Z4802 Encounter for removal of sutures: Secondary | ICD-10-CM

## 2022-02-13 DIAGNOSIS — C4492 Squamous cell carcinoma of skin, unspecified: Secondary | ICD-10-CM

## 2022-02-13 DIAGNOSIS — Z85828 Personal history of other malignant neoplasm of skin: Secondary | ICD-10-CM

## 2022-02-13 DIAGNOSIS — L570 Actinic keratosis: Secondary | ICD-10-CM

## 2022-02-13 DIAGNOSIS — L578 Other skin changes due to chronic exposure to nonionizing radiation: Secondary | ICD-10-CM

## 2022-02-13 NOTE — Patient Instructions (Signed)
After Suture Removal  If your medical team has placed Steri-Strips (white adhesive strips covering the surgical site to provide extra support): Keep the area dry until they fall off.  Do not peel them off. Just let them fall off on their own.  If the edges peel up, you can trim them with scissors.   If your team has not placed Steri-Strips: Wash the area daily with soap and water. Then coat the incision site with plain Vaseline and cover with a bandage. Do this daily for 5 days after the sutures are removed. After that, no additional wound care is generally needed.  However, if you would like to help fade the scar, you can apply a silicone scar cream, gel or sheet every night. The scar will remodel for one year after the procedure. If a skin cancer was removed, be sure to keep your appointment with your dermatologist for follow-up and let your dermatology team know if you have any new or changing spots between visits.    Please call our office at [phone] for any questions or concerns.       Due to recent changes in healthcare laws, you may see results of your pathology and/or laboratory studies on MyChart before the doctors have had a chance to review them. We understand that in some cases there may be results that are confusing or concerning to you. Please understand that not all results are received at the same time and often the doctors may need to interpret multiple results in order to provide you with the best plan of care or course of treatment. Therefore, we ask that you please give us 2 business days to thoroughly review all your results before contacting the office for clarification. Should we see a critical lab result, you will be contacted sooner.   If You Need Anything After Your Visit  If you have any questions or concerns for your doctor, please call our main line at [phone] and press option 4 to reach your doctor's medical assistant. If no one answers, please leave  a voicemail as directed and we will return your call as soon as possible. Messages left after 4 pm will be answered the following business day.   You may also send us a message via MyChart. We typically respond to MyChart messages within 1-2 business days.  For prescription refills, please ask your pharmacy to contact our office. Our fax number is [phone].  If you have an urgent issue when the clinic is closed that cannot wait until the next business day, you can page your doctor at the number below.    Please note that while we do our best to be available for urgent issues outside of office hours, we are not available 24/7.   If you have an urgent issue and are unable to reach us, you may choose to seek medical care at your doctor's office, retail clinic, urgent care center, or emergency room.  If you have a medical emergency, please immediately call 911 or go to the emergency department.  Pager Numbers  - Dr. Kowalski: [phone]  - Dr. Moye: [phone]  - Dr. Stewart: [phone]  In the event of inclement weather, please call our main line at [phone] for an update on the status of any delays or closures.  Dermatology Medication Tips: Please keep the boxes that topical medications come in in order to help keep track of the instructions about where and how to use these. Pharmacies typically print the medication   instructions only on the boxes and not directly on the medication tubes.   If your medication is too expensive, please contact our office at [phone] option 4 or send us a message through MyChart.   We are unable to tell what your co-pay for medications will be in advance as this is different depending on your insurance coverage. However, we may be able to find a substitute medication at lower cost or fill out paperwork to get insurance to cover a needed medication.   If a prior authorization is required to get your medication covered by your insurance  company, please allow us 1-2 business days to complete this process.  Drug prices often vary depending on where the prescription is filled and some pharmacies may offer cheaper prices.  The website www.goodrx.com contains coupons for medications through different pharmacies. The prices here do not account for what the cost may be with help from insurance (it may be cheaper with your insurance), but the website can give you the price if you did not use any insurance.  - You can print the associated coupon and take it with your prescription to the pharmacy.  - You may also stop by our office during regular business hours and pick up a GoodRx coupon card.  - If you need your prescription sent electronically to a different pharmacy, notify our office through [facility] MyChart or by phone at [phone] option 4.     Si Usted Necesita Algo Despus de Su Visita  Tambin puede enviarnos un mensaje a travs de MyChart. Por lo general respondemos a los mensajes de MyChart en el transcurso de 1 a 2 das hbiles.  Para renovar recetas, por favor pida a su farmacia que se ponga en contacto con nuestra oficina. Nuestro nmero de fax es el [phone].  Si tiene un asunto urgente cuando la clnica est cerrada y que no puede esperar hasta el siguiente da hbil, puede llamar/localizar a su doctor(a) al nmero que aparece a continuacin.   Por favor, tenga en cuenta que aunque hacemos todo lo posible para estar disponibles para asuntos urgentes fuera del horario de oficina, no estamos disponibles las 24 horas del da, los 7 das de la semana.   Si tiene un problema urgente y no puede comunicarse con nosotros, puede optar por buscar atencin mdica  en el consultorio de su doctor(a), en una clnica privada, en un centro de atencin urgente o en una sala de emergencias.  Si tiene una emergencia mdica, por favor llame inmediatamente al 911 o vaya a la sala de emergencias.  Nmeros de bper  - Dr.  Kowalski: [phone]  - Dra. Moye: [phone]  - Dra. Stewart: [phone]  En caso de inclemencias del tiempo, por favor llame a nuestra lnea principal al [phone] para una actualizacin sobre el estado de cualquier retraso o cierre.  Consejos para la medicacin en dermatologa: Por favor, guarde las cajas en las que vienen los medicamentos de uso tpico para ayudarle a seguir las instrucciones sobre dnde y cmo usarlos. Las farmacias generalmente imprimen las instrucciones del medicamento slo en las cajas y no directamente en los tubos del medicamento.   Si su medicamento es muy caro, por favor, pngase en contacto con nuestra oficina llamando al [phone] y presione la opcin 4 o envenos un mensaje a travs de MyChart.   No podemos decirle cul ser su copago por los medicamentos por adelantado ya que esto es diferente dependiendo de la cobertura de su seguro. Sin embargo,   es posible que podamos encontrar un medicamento sustituto a menor costo o llenar un formulario para que el seguro cubra el medicamento que se considera necesario.   Si se requiere una autorizacin previa para que su compaa de seguros cubra su medicamento, por favor permtanos de 1 a 2 das hbiles para completar este proceso.  Los precios de los medicamentos varan con frecuencia dependiendo del lugar de dnde se surte la receta y alguna farmacias pueden ofrecer precios ms baratos.  El sitio web www.goodrx.com tiene cupones para medicamentos de diferentes farmacias. Los precios aqu no tienen en cuenta lo que podra costar con la ayuda del seguro (puede ser ms barato con su seguro), pero el sitio web puede darle el precio si no utiliz ningn seguro.  - Puede imprimir el cupn correspondiente y llevarlo con su receta a la farmacia.  - Tambin puede pasar por nuestra oficina durante el horario de atencin regular y recoger una tarjeta de cupones de GoodRx.  - Si necesita que su receta se enve  electrnicamente a una farmacia diferente, informe a nuestra oficina a travs de MyChart de  o por telfono llamando al [phone] y presione la opcin 4.  

## 2022-02-13 NOTE — Progress Notes (Signed)
   Follow-Up Visit   Subjective  Wayne Sanford is a 51 y.o. male who presents for the following: Post op (SCC, margins free with incidental BCC, margins free of the right lateral cheek).   The following portions of the chart were reviewed this encounter and updated as appropriate:       Review of Systems:  No other skin or systemic complaints except as noted in HPI or Assessment and Plan.  Objective  Well appearing patient in no apparent distress; mood and affect are within normal limits.  A focused examination was performed including face. Relevant physical exam findings are noted in the Assessment and Plan.  right lateral cheek Excision site healing well, no evidence of infection     Assessment & Plan  Actinic Damage with PreCancerous Actinic Keratoses Counseling for Topical Chemotherapy Management: Patient exhibits: - Severe, confluent actinic changes with pre-cancerous actinic keratoses that is secondary to cumulative UV radiation exposure over time - Condition that is severe; chronic, not at goal. - diffuse scaly erythematous macules and papules with underlying dyspigmentation - Discussed Prescription "Field Treatment" topical Chemotherapy for Severe, Chronic Confluent Actinic Changes with Pre-Cancerous Actinic Keratoses Field treatment involves treatment of an entire area of skin that has confluent Actinic Changes (Sun/ Ultraviolet light damage) and PreCancerous Actinic Keratoses by method of PhotoDynamic Therapy (PDT) and/or prescription Topical Chemotherapy agents such as 5-fluorouracil, 5-fluorouracil/calcipotriene, and/or imiquimod.  The purpose is to decrease the number of clinically evident and subclinical PreCancerous lesions to prevent progression to development of skin cancer by chemically destroying early precancer changes that may or may not be visible.  It has been shown to reduce the risk of developing skin cancer in the treated area. As a result of treatment,  redness, scaling, crusting, and open sores may occur during treatment course. One or more than one of these methods may be used and may have to be used several times to control, suppress and eliminate the PreCancerous changes. Discussed treatment course, expected reaction, and possible side effects. - Recommend daily broad spectrum sunscreen SPF 30+ to sun-exposed areas, reapply every 2 hours as needed.  - Staying in the shade or wearing long sleeves, sun glasses (UVA+UVB protection) and wide brim hats (4-inch brim around the entire circumference of the hat) are also recommended. - Call for new or changing lesions. 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  Wound cleansed, sutures removed, wound cleansed and steri strips applied. Discussed pathology results.    Return as scheduled.  IJamesetta Orleans, CMA, am acting as scribe for Brendolyn Patty, MD .  Documentation: I have reviewed the above documentation for accuracy and completeness, and I agree with the above.  Brendolyn Patty MD

## 2022-02-16 ENCOUNTER — Telehealth: Payer: Self-pay | Admitting: Family

## 2022-02-16 ENCOUNTER — Encounter: Payer: Self-pay | Admitting: Family

## 2022-02-16 NOTE — Telephone Encounter (Signed)
close

## 2022-02-16 NOTE — Telephone Encounter (Signed)
-----   Message from Christie Beckers, RN sent at 02/06/2022 10:17 AM EDT ----- We will schedule pt for a PET and forward results once available

## 2022-02-20 ENCOUNTER — Ambulatory Visit
Admission: RE | Admit: 2022-02-20 | Discharge: 2022-02-20 | Disposition: A | Discharge status: Home / Self Care | Payer: 59 | Source: Ambulatory Visit | Attending: Acute Care | Admitting: Acute Care

## 2022-02-20 DIAGNOSIS — R911 Solitary pulmonary nodule: Secondary | ICD-10-CM | POA: Diagnosis present

## 2022-02-20 LAB — GLUCOSE, CAPILLARY: Glucose-Capillary: 171 mg/dL — ABNORMAL HIGH (ref 70–99)

## 2022-02-20 MED ORDER — FLUDEOXYGLUCOSE F - 18 (FDG) INJECTION
11.3000 | Freq: Once | INTRAVENOUS | Status: AC | PRN
Start: 2022-02-20 — End: 2022-02-20
  Administered 2022-02-20: 12.23 via INTRAVENOUS

## 2022-02-21 ENCOUNTER — Institutional Professional Consult (permissible substitution): Payer: 59 | Admitting: Pulmonary Disease

## 2022-02-23 ENCOUNTER — Encounter: Payer: Self-pay | Admitting: Student in an Organized Health Care Education/Training Program

## 2022-02-23 ENCOUNTER — Ambulatory Visit (INDEPENDENT_AMBULATORY_CARE_PROVIDER_SITE_OTHER): Payer: 59 | Admitting: Student in an Organized Health Care Education/Training Program

## 2022-02-23 VITALS — BP 122/62 | HR 65 | Temp 98.1°F | Ht 71.0 in | Wt 222.0 lb

## 2022-02-23 DIAGNOSIS — R911 Solitary pulmonary nodule: Secondary | ICD-10-CM
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
## 2022-02-23 NOTE — Patient Instructions (Signed)
Today we discussed your pulmonary nodule and the different etiologies behind it. We discussed the different approaches of how to manage it, including watchful waiting, biopsy or resection. We will proceed with biopsy (robotic assisted navigational bronchoscopy) to obtain tissue to diagnose the etiology of the nodule.
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
## 2022-03-06 ENCOUNTER — Telehealth: Payer: Self-pay | Admitting: Student in an Organized Health Care Education/Training Program

## 2022-03-06 NOTE — Telephone Encounter (Signed)
Wayne Sanford with Lung Cancer Screening Program sent me a message this morning. Is Dr Genia Harold still planning on doing a bronch on this pt?  Your note states" We will proceed with biopsy (robotic assisted navigational bronchoscopy) to obtain tissue to diagnose the etiology of the nodule".  Is this in the works?

## 2022-03-06 NOTE — Telephone Encounter (Signed)
This has been addressed via epic secure message. Dr. Genia Harold is currently in the process of getting patient scheduled for ION procedure at [facility].

## 2022-03-08 ENCOUNTER — Telehealth: Payer: Self-pay | Admitting: Pulmonary Disease

## 2022-03-08 ENCOUNTER — Encounter: Payer: Self-pay | Admitting: Student in an Organized Health Care Education/Training Program

## 2022-03-08 DIAGNOSIS — R911 Solitary pulmonary nodule: Secondary | ICD-10-CM

## 2022-03-08 NOTE — Telephone Encounter (Signed)
PCCM:  I placed orders for Dr. Genia Harold Case to be completed at 7:30 AM on 03/17/2022.  Garner Nash, DO [facility] 03/08/2022 9:58 AM

## 2022-03-13 ENCOUNTER — Other Ambulatory Visit: Payer: Self-pay | Admitting: Student in an Organized Health Care Education/Training Program

## 2022-03-13 DIAGNOSIS — R911 Solitary pulmonary nodule: Secondary | ICD-10-CM

## 2022-03-13 NOTE — Progress Notes (Signed)
CT has been scheduled for 03/14/22 @ 1:00pm at [facility] and patient is aware of the appt

## 2022-03-13 NOTE — Progress Notes (Signed)
Patient is aware of below message and voiced his understanding.   Rodena Piety, please advise. Thanks

## 2022-03-13 NOTE — Progress Notes (Signed)
Lm for patient to make him aware of need for CT.

## 2022-03-13 NOTE — Progress Notes (Signed)
CT Super D for procedural planning ordered.  Armando Reichert, MD [facility] 03/13/2022 9:42 AM

## 2022-03-14 ENCOUNTER — Ambulatory Visit
Admission: RE | Admit: 2022-03-14 | Discharge: 2022-03-14 | Disposition: A | Discharge status: Home / Self Care | Payer: 59 | Source: Ambulatory Visit | Attending: Student in an Organized Health Care Education/Training Program | Admitting: Student in an Organized Health Care Education/Training Program

## 2022-03-14 DIAGNOSIS — R911 Solitary pulmonary nodule: Secondary | ICD-10-CM | POA: Diagnosis present

## 2022-03-17 ENCOUNTER — Ambulatory Visit (HOSPITAL_COMMUNITY)
Admission: RE | Admit: 2022-03-17 | Discharge: 2022-03-17 | Disposition: A | Discharge status: Home / Self Care | Payer: 59 | Attending: Student in an Organized Health Care Education/Training Program | Admitting: Student in an Organized Health Care Education/Training Program

## 2022-03-17 ENCOUNTER — Ambulatory Visit (HOSPITAL_COMMUNITY): Payer: 59

## 2022-03-17 ENCOUNTER — Telehealth: Payer: Self-pay

## 2022-03-17 ENCOUNTER — Ambulatory Visit (HOSPITAL_COMMUNITY): Payer: 59 | Admitting: Anesthesiology

## 2022-03-17 ENCOUNTER — Encounter (HOSPITAL_COMMUNITY)
Admission: RE | Disposition: A | Discharge status: Home / Self Care | Payer: Self-pay | Source: Home / Self Care | Attending: Student in an Organized Health Care Education/Training Program

## 2022-03-17 ENCOUNTER — Encounter (HOSPITAL_COMMUNITY): Payer: Self-pay | Admitting: Student in an Organized Health Care Education/Training Program

## 2022-03-17 ENCOUNTER — Ambulatory Visit (HOSPITAL_BASED_OUTPATIENT_CLINIC_OR_DEPARTMENT_OTHER): Payer: 59 | Admitting: Anesthesiology

## 2022-03-17 ENCOUNTER — Other Ambulatory Visit: Payer: Self-pay

## 2022-03-17 DIAGNOSIS — Z87891 Personal history of nicotine dependence: Secondary | ICD-10-CM | POA: Diagnosis not present

## 2022-03-17 DIAGNOSIS — R911 Solitary pulmonary nodule: Secondary | ICD-10-CM | POA: Diagnosis present

## 2022-03-17 DIAGNOSIS — Z1152 Encounter for screening for COVID-19: Secondary | ICD-10-CM | POA: Insufficient documentation

## 2022-03-17 HISTORY — PX: BRONCHIAL NEEDLE ASPIRATION BIOPSY: SHX5106

## 2022-03-17 HISTORY — PX: BRONCHIAL BRUSHINGS: SHX5108

## 2022-03-17 HISTORY — PX: BRONCHIAL BIOPSY: SHX5109

## 2022-03-17 HISTORY — PX: VIDEO BRONCHOSCOPY WITH ENDOBRONCHIAL ULTRASOUND: SHX6177

## 2022-03-17 LAB — POCT I-STAT, CHEM 8
BUN: 20 mg/dL (ref 6–20)
Calcium, Ion: 1.18 mmol/L (ref 1.15–1.40)
Chloride: 104 mmol/L (ref 98–111)
Creatinine, Ser: 1.2 mg/dL (ref 0.61–1.24)
Glucose, Bld: 205 mg/dL — ABNORMAL HIGH (ref 70–99)
HCT: 42 % (ref 39.0–52.0)
Hemoglobin: 14.3 g/dL (ref 13.0–17.0)
Potassium: 5 mmol/L (ref 3.5–5.1)
Sodium: 138 mmol/L (ref 135–145)
TCO2: 27 mmol/L (ref 22–32)

## 2022-03-17 LAB — SARS CORONAVIRUS 2 BY RT PCR: SARS Coronavirus 2 by RT PCR: NEGATIVE

## 2022-03-17 SURGERY — BRONCHOSCOPY, WITH BIOPSY USING ELECTROMAGNETIC NAVIGATION
Anesthesia: General

## 2022-03-17 MED ORDER — KETOROLAC TROMETHAMINE 30 MG/ML IJ SOLN: 30.0000 mg | Freq: Once | INTRAMUSCULAR | Status: DC | PRN

## 2022-03-17 MED ORDER — ACETAMINOPHEN 500 MG PO TABS
ORAL_TABLET | ORAL | Status: AC
  Filled 2022-03-17: qty 2

## 2022-03-17 MED ORDER — PROMETHAZINE HCL 25 MG/ML IJ SOLN: 6.2500 mg | INTRAMUSCULAR | Status: DC | PRN

## 2022-03-17 MED ORDER — FENTANYL CITRATE (PF) 100 MCG/2ML IJ SOLN
INTRAMUSCULAR | Status: DC | PRN
  Administered 2022-03-17: 100 ug via INTRAVENOUS

## 2022-03-17 MED ORDER — ONDANSETRON HCL 4 MG/2ML IJ SOLN
INTRAMUSCULAR | Status: DC | PRN
  Administered 2022-03-17: 4 mg via INTRAVENOUS

## 2022-03-17 MED ORDER — MIDAZOLAM HCL 2 MG/2ML IJ SOLN
INTRAMUSCULAR | Status: DC | PRN
  Administered 2022-03-17: 2 mg via INTRAVENOUS

## 2022-03-17 MED ORDER — PROPOFOL 10 MG/ML IV BOLUS
INTRAVENOUS | Status: DC | PRN
  Administered 2022-03-17: 200 mg via INTRAVENOUS

## 2022-03-17 MED ORDER — OXYCODONE HCL 5 MG PO TABS: 5.0000 mg | ORAL_TABLET | Freq: Once | ORAL | Status: DC | PRN

## 2022-03-17 MED ORDER — ACETAMINOPHEN 500 MG PO TABS
1000.0000 mg | ORAL_TABLET | Freq: Once | ORAL | Status: AC
  Administered 2022-03-17: 1000 mg via ORAL

## 2022-03-17 MED ORDER — SUGAMMADEX SODIUM 200 MG/2ML IV SOLN
INTRAVENOUS | Status: DC | PRN
  Administered 2022-03-17: 200 mg via INTRAVENOUS

## 2022-03-17 MED ORDER — DEXAMETHASONE SODIUM PHOSPHATE 10 MG/ML IJ SOLN
INTRAMUSCULAR | Status: DC | PRN
  Administered 2022-03-17: 10 mg via INTRAVENOUS

## 2022-03-17 MED ORDER — LIDOCAINE HCL (CARDIAC) PF 100 MG/5ML IV SOSY
PREFILLED_SYRINGE | INTRAVENOUS | Status: DC | PRN
  Administered 2022-03-17: 60 mg via INTRATRACHEAL

## 2022-03-17 MED ORDER — GLYCOPYRROLATE 0.2 MG/ML IJ SOLN
INTRAMUSCULAR | Status: DC | PRN
  Administered 2022-03-17: .2 mg via INTRAVENOUS

## 2022-03-17 MED ORDER — LACTATED RINGERS IV SOLN
INTRAVENOUS | Status: DC
  Administered 2022-03-17: 1000 mL via INTRAVENOUS

## 2022-03-17 MED ORDER — ROCURONIUM BROMIDE 100 MG/10ML IV SOLN
INTRAVENOUS | Status: DC | PRN
  Administered 2022-03-17: 20 mg via INTRAVENOUS
  Administered 2022-03-17: 40 mg via INTRAVENOUS

## 2022-03-17 MED ORDER — AMISULPRIDE (ANTIEMETIC) 5 MG/2ML IV SOLN: 10.0000 mg | Freq: Once | INTRAVENOUS | Status: DC | PRN

## 2022-03-17 MED ORDER — FENTANYL CITRATE (PF) 100 MCG/2ML IJ SOLN: 25.0000 ug | INTRAMUSCULAR | Status: DC | PRN

## 2022-03-17 MED ORDER — OXYCODONE HCL 5 MG/5ML PO SOLN: 5.0000 mg | Freq: Once | ORAL | Status: DC | PRN

## 2022-03-17 NOTE — Telephone Encounter (Signed)
-----   Message from Armando Reichert, MD sent at 03/17/2022  8:41 AM EST ----- Regarding: appointment Hi Wayne Sanford - can you please call the patient later this afternoon and offer him an appointment with me either 12/8 or 12/11. This is to follow up on bronch results.  Thanks, [employer]

## 2022-03-17 NOTE — Anesthesia Procedure Notes (Signed)
Procedure Name: Intubation Date/Time: 03/17/2022 7:33 AM  Performed by: Mariea Clonts, CRNAPre-anesthesia Checklist: Patient identified, Emergency Drugs available, Suction available and Patient being monitored Patient Re-evaluated:Patient Re-evaluated prior to induction Oxygen Delivery Method: Circle System Utilized Preoxygenation: Pre-oxygenation with 100% oxygen Induction Type: IV induction Ventilation: Mask ventilation without difficulty and Oral airway inserted - appropriate to patient size Laryngoscope Size: Sabra Heck and 2 Grade View: Grade II Tube type: Oral Tube size: 8.5 mm Number of attempts: 1 Airway Equipment and Method: Stylet and Oral airway Placement Confirmation: ETT inserted through vocal cords under direct vision, positive ETCO2 and breath sounds checked- equal and bilateral Tube secured with: Tape Dental Injury: Teeth and Oropharynx as per pre-operative assessment

## 2022-03-17 NOTE — Telephone Encounter (Signed)
Spoke to patient and scheduled appt for 03/27/2022 at 4:30. He voiced his understanding.  Nothing further needed.

## 2022-03-17 NOTE — Op Note (Signed)
Video Bronchoscopy with Robotic Assisted Bronchoscopic Navigation   Date of Operation: 03/17/2022   Pre-op Diagnosis: lung nodule  Post-op Diagnosis: lung nodule  Surgeon: Armando Reichert, MD  Assistants: Baltazar Apo, MD  Anesthesia: General endotracheal anesthesia  Operation: Flexible video fiberoptic bronchoscopy with robotic assistance and biopsies.  Estimated Blood Loss: Minimal  Complications: None  Indications and History: Wayne Sanford is a 51 y.o. male with no significant past medical history presented for evaluation of a LLL nodule found on lung cancer screening. He presents for robotic assisted navigational bronchoscopy. The risks, benefits, complications, treatment options and expected outcomes were discussed with the patient.  The possibilities of pneumothorax, pneumonia, reaction to medication, pulmonary aspiration, perforation of a viscus, bleeding, failure to diagnose a condition and creating a complication requiring transfusion or operation were discussed with the patient who freely signed the consent.    Description of Procedure: The patient was seen in the Preoperative Area, was examined and was deemed appropriate to proceed.  The patient was taken to [facility] endoscopy room 3, identified as Missy Sabins and the procedure verified as Flexible Video Fiberoptic Bronchoscopy, Robotic assisted navigational bronchoscopy, and EBUS.  A Time Out was held and the above information confirmed.   Prior to the date of the procedure a high-resolution CT scan of the chest was performed. Utilizing ION software program a virtual tracheobronchial tree was generated to allow the creation of distinct navigation pathways to the patient's parenchymal abnormalities. After being taken to the operating room general anesthesia was initiated and the patient  was orally intubated. The video fiberoptic bronchoscope was introduced via the endotracheal tube and a general inspection was performed which showed  normal right and left lung anatomy, aspiration of the bilateral mainstems was completed to remove any remaining secretions. Robotic catheter inserted into patient's endotracheal tube.   Target #1 LLL: The distinct navigation pathways prepared prior to this procedure were then utilized to navigate to patient's lesion identified on CT scan. The robotic catheter was secured into place and the vision probe was withdrawn.  Lesion location was approximated using fluoroscopy. The CI[employer] Cone Beam system was then utilized to confirm the location of the catheter in relation to the nodule. The nodule location in ION navigational system was updated based on the cone beam CT scan. Under fluoroscopic guidance transbronchial cytology brushings, transbronchial needle biopsies, and transbronchial forceps biopsies were performed to be sent for cytology and pathology. One FNA was sent for aerobic, anaerobic and AFB culture.  At the end of the procedure an EBUS was performed to evaluate for enlarged lymph nodes; not such nodes were found at 4R, 4L, 7, and 11L. A general airway inspection was then performed and there was no evidence of active bleeding. The bronchoscope was removed.  The patient tolerated the procedure well. There was no significant blood loss and there were no obvious complications. A post-procedural chest x-ray is pending.  Samples Target #1: 1. Transbronchial cytology brushings from LLL, x2 2. Transbronchial FNA biopsies from LLL x5 3. Transbronchial forceps biopsies from LLL x4  Plans:  The patient will be discharged from the PACU to home when recovered from anesthesia and after chest x-ray is reviewed. We will review the cytology, pathology and microbiology results with the patient when they become available. Outpatient followup will be with Armando Reichert, MD.  Armando Reichert, MD [facility] 03/17/2022 8:36 AM

## 2022-03-17 NOTE — Transfer of Care (Signed)
Immediate Anesthesia Transfer of Care Note  Patient: Wayne Sanford  Procedure(s) Performed: ROBOTIC ASSISTED NAVIGATIONAL BRONCHOSCOPY BRONCHIAL NEEDLE ASPIRATION BIOPSIES BRONCHIAL BIOPSIES BRONCHIAL BRUSHINGS VIDEO BRONCHOSCOPY WITH ENDOBRONCHIAL ULTRASOUND  Patient Location: PACU  Anesthesia Type:General  Level of Consciousness: awake, alert , and oriented  Airway & Oxygen Therapy: Patient Spontanous Breathing and Patient connected to nasal cannula oxygen  Post-op Assessment: Report given to RN, Post -op Vital signs reviewed and stable, and Patient moving all extremities X 4  Post vital signs: Reviewed and stable  Last Vitals:  Vitals Value Taken Time  BP 116/57 03/17/22 0832  Temp 36.5 C 03/17/22 0832  Pulse 65 03/17/22 0844  Resp 17 03/17/22 0844  SpO2 96 % 03/17/22 0844  Vitals shown include unvalidated device data.  Last Pain:  Vitals:   03/17/22 0832  PainSc: 0-No pain         Complications: No notable events documented.

## 2022-03-17 NOTE — Discharge Instructions (Signed)
Flexible Bronchoscopy, Care After This sheet gives you information about how to care for yourself after your test. Your doctor may also give you more specific instructions. If you have problems or questions, contact your doctor. Follow these instructions at home: Eating and drinking Do not eat or drink anything (not even water) for 2 hours after your test, or until your numbing medicine (local anesthetic) wears off. When your numbness is gone and your cough and gag reflexes have come back, you may: Eat only soft foods. Slowly drink liquids. The day after the test, go back to your normal diet. Driving Do not drive for 24 hours if you were given a medicine to help you relax (sedative). Do not drive or use heavy machinery while taking prescription pain medicine. General instructions  Take over-the-counter and prescription medicines only as told by your doctor. Return to your normal activities as told. Ask what activities are safe for you. Do not use any products that have nicotine or tobacco in them. This includes cigarettes and e-cigarettes. If you need help quitting, ask your doctor. Keep all follow-up visits as told by your doctor. This is important. It is very important if you had a tissue sample (biopsy) taken. Get help right away if: You have shortness of breath that gets worse. You get light-headed. You feel like you are going to pass out (faint). You have chest pain. You cough up: More than a little blood. More blood than before. Summary Do not eat or drink anything (not even water) for 2 hours after your test, or until your numbing medicine wears off. Do not use cigarettes. Do not use e-cigarettes. Get help right away if you have chest pain.  This information is not intended to replace advice given to you by your health care provider. Make sure you discuss any questions you have with your health care provider. Document Released: 01/29/2009 Document Revised: 03/16/2017 Document  Reviewed: 04/21/2016 Elsevier Patient Education  2020 [employer].

## 2022-03-17 NOTE — Anesthesia Preprocedure Evaluation (Addendum)
Anesthesia Evaluation  Patient identified by MRN, date of birth, ID band Patient awake    Reviewed: Allergy & Precautions, NPO status , Patient's Chart, lab work & pertinent test results  Airway Mallampati: II  TM Distance: >3 FB Neck ROM: Full    Dental no notable dental hx.    Pulmonary former smoker   Pulmonary exam normal        Cardiovascular negative cardio ROS Normal cardiovascular exam     Neuro/Psych  PSYCHIATRIC DISORDERS Anxiety Depression     Neuromuscular disease    GI/Hepatic negative GI ROS, Neg liver ROS,,,  Endo/Other  negative endocrine ROS    Renal/GU negative Renal ROS     Musculoskeletal negative musculoskeletal ROS (+)    Abdominal   Peds  Hematology negative hematology ROS (+)   Anesthesia Other Findings lung nodule  Reproductive/Obstetrics                             Anesthesia Physical Anesthesia Plan  ASA: 2  Anesthesia Plan: General   Post-op Pain Management:    Induction: Intravenous  PONV Risk Score and Plan: 2 and Ondansetron, Dexamethasone, Propofol infusion, Midazolam and Treatment may vary due to age or medical condition  Airway Management Planned: Oral ETT  Additional Equipment:   Intra-op Plan:   Post-operative Plan: Extubation in OR  Informed Consent: I have reviewed the patients History and Physical, chart, labs and discussed the procedure including the risks, benefits and alternatives for the proposed anesthesia with the patient or authorized representative who has indicated his/her understanding and acceptance.     Dental advisory given  Plan Discussed with: CRNA  Anesthesia Plan Comments:        Anesthesia Quick Evaluation

## 2022-03-17 NOTE — Interval H&P Note (Signed)
Patient seen and examined this morning. No change to symptoms. Presents for robotic assisted navigational bronchoscopy to LLL nodule with cone beam guidance. All the questions were answered.  Armando Reichert, MD [facility] 03/17/2022 6:57 AM

## 2022-03-18 NOTE — Anesthesia Postprocedure Evaluation (Signed)
Anesthesia Post Note  Patient: Wayne Sanford  Procedure(s) Performed: ROBOTIC ASSISTED NAVIGATIONAL BRONCHOSCOPY BRONCHIAL NEEDLE ASPIRATION BIOPSIES BRONCHIAL BIOPSIES BRONCHIAL BRUSHINGS VIDEO BRONCHOSCOPY WITH ENDOBRONCHIAL ULTRASOUND     Patient location during evaluation: PACU Anesthesia Type: General Level of consciousness: awake Pain management: pain level controlled Vital Signs Assessment: post-procedure vital signs reviewed and stable Respiratory status: spontaneous breathing, nonlabored ventilation and respiratory function stable Cardiovascular status: blood pressure returned to baseline and stable Postop Assessment: no apparent nausea or vomiting Anesthetic complications: no   No notable events documented.  Last Vitals:  Vitals:   03/17/22 0845 03/17/22 0900  BP: 107/69 116/69  Pulse: 63 (!) 58  Resp: 16 16  Temp:  36.5 C  SpO2: 96% 95%    Last Pain:  Vitals:   03/17/22 0900  PainSc: 0-No pain                 Rickard Kennerly P Serita Degroote

## 2022-03-20 ENCOUNTER — Encounter (HOSPITAL_COMMUNITY): Payer: Self-pay | Admitting: Student in an Organized Health Care Education/Training Program

## 2022-03-20 LAB — CYTOLOGY - NON PAP

## 2022-03-21 LAB — ACID FAST SMEAR (AFB, MYCOBACTERIA): Acid Fast Smear: NEGATIVE

## 2022-03-22 LAB — AEROBIC/ANAEROBIC CULTURE W GRAM STAIN (SURGICAL/DEEP WOUND)
Culture: NO GROWTH
Gram Stain: NONE SEEN

## 2022-03-27 ENCOUNTER — Encounter: Payer: Self-pay | Admitting: Student in an Organized Health Care Education/Training Program

## 2022-03-27 ENCOUNTER — Ambulatory Visit (INDEPENDENT_AMBULATORY_CARE_PROVIDER_SITE_OTHER): Payer: 59 | Admitting: Student in an Organized Health Care Education/Training Program

## 2022-03-27 VITALS — BP 122/80 | HR 65 | Temp 97.9°F | Ht 71.0 in | Wt 222.2 lb

## 2022-03-27 DIAGNOSIS — R911 Solitary pulmonary nodule: Secondary | ICD-10-CM | POA: Diagnosis not present
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
## 2022-04-18 LAB — FUNGUS CULTURE WITH STAIN

## 2022-04-18 LAB — FUNGUS CULTURE RESULT

## 2022-04-18 LAB — FUNGAL ORGANISM REFLEX

## 2022-05-01 LAB — ACID FAST CULTURE WITH REFLEXED SENSITIVITIES (MYCOBACTERIA): Acid Fast Culture: NEGATIVE

## 2022-05-08 ENCOUNTER — Encounter: Payer: Self-pay | Admitting: Family

## 2022-05-08 ENCOUNTER — Ambulatory Visit (INDEPENDENT_AMBULATORY_CARE_PROVIDER_SITE_OTHER): Payer: 59 | Admitting: Family

## 2022-05-08 VITALS — BP 124/78 | HR 86 | Temp 98.3°F | Ht 71.0 in | Wt 222.0 lb

## 2022-05-08 DIAGNOSIS — E119 Type 2 diabetes mellitus without complications: Secondary | ICD-10-CM

## 2022-05-08 DIAGNOSIS — R7309 Other abnormal glucose: Secondary | ICD-10-CM | POA: Diagnosis not present

## 2022-05-08 LAB — POCT GLYCOSYLATED HEMOGLOBIN (HGB A1C): HbA1c POC (<> result, manual entry): 7.6 % (ref 4.0–5.6)

## 2022-05-08 NOTE — Assessment & Plan Note (Addendum)
Lab Results  Component Value Date   HGBA1C 7.6 05/08/2022   Patient declines starting metformin at this time.  Discussed microvascular changes, endorgan disease with uncontrolled diabetes.  he would like to focus on lifestyle modifications including avoiding soda, sweet tea.  Close follow-up

## 2022-05-08 NOTE — Patient Instructions (Addendum)
As discussed, most important to increase exercise and decrease sugary drinks to lower A1c.  Goal of A1c is 6.5.  As discussed, could consider very low-dose of metformin in the future.  Metformin is used in prediabetes, diabetes, and also for weight loss by decreasing calorie consumption.   It works in a couple of ways by decreasing liver glucose production, decreases intestinal absorption of glucose and improves insulin sensitivity (increases peripheral glucose uptake and utilization).   Referral for annual eye exam Let us know if you dont hear back within a week in regards to an appointment being scheduled.   So that you are aware, if you are [facility] MyChart user , please pay attention to your MyChart messages as you may receive a MyChart message with a phone number to call and schedule this test/appointment own your own from our referral coordinator. This is a new process so I do not want you to miss this message.  If you are not a MyChart user, you will receive a phone call.
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
## 2022-06-12 ENCOUNTER — Ambulatory Visit (INDEPENDENT_AMBULATORY_CARE_PROVIDER_SITE_OTHER): Payer: 59 | Admitting: Dermatology

## 2022-06-12 VITALS — BP 118/76 | HR 72

## 2022-06-12 DIAGNOSIS — L814 Other melanin hyperpigmentation: Secondary | ICD-10-CM | POA: Diagnosis not present

## 2022-06-12 DIAGNOSIS — L821 Other seborrheic keratosis: Secondary | ICD-10-CM

## 2022-06-12 DIAGNOSIS — L570 Actinic keratosis: Secondary | ICD-10-CM

## 2022-06-12 DIAGNOSIS — Z85828 Personal history of other malignant neoplasm of skin: Secondary | ICD-10-CM

## 2022-06-12 DIAGNOSIS — L910 Hypertrophic scar: Secondary | ICD-10-CM

## 2022-06-12 DIAGNOSIS — L578 Other skin changes due to chronic exposure to nonionizing radiation: Secondary | ICD-10-CM

## 2022-06-12 NOTE — Progress Notes (Signed)
Follow-Up Visit   Subjective  Wayne Sanford is a 52 y.o. male who presents for the following: Follow-up.  Patient presents for 5 month follow-up Aks. He has a history of multiple BCCs and SCC. He has noticed a hard area at [facility] site of the right lateral cheek that was excised 02/06/2022.  He hasn't used the 5FU/Vit D cream yet.  The following portions of the chart were reviewed this encounter and updated as appropriate:       Review of Systems:  No other skin or systemic complaints except as noted in HPI or Assessment and Plan.  Objective  Well appearing patient in no apparent distress; mood and affect are within normal limits.  A focused examination was performed including face, chest. Relevant physical exam findings are noted in the Assessment and Plan.  right lateral cheek Well healed scar with thickening; no evidence of recurrence.  Right lateral cheek, Right lower chest Firm pink/flesh plaques of the right lateral cheek (linear) and right lower chest (chest 2 x 1 CM)        Assessment & Plan  Actinic Damage with PreCancerous Actinic Keratoses Counseling for Topical Chemotherapy Management: Patient exhibits: - Severe, confluent actinic changes with pre-cancerous actinic keratoses that is secondary to cumulative UV radiation exposure over time - Condition that is severe; chronic, not at goal. - diffuse scaly erythematous macules and papules with underlying dyspigmentation - Discussed Prescription "Field Treatment" topical Chemotherapy for Severe, Chronic Confluent Actinic Changes with Pre-Cancerous Actinic Keratoses Field treatment involves treatment of an entire area of skin that has confluent Actinic Changes (Sun/ Ultraviolet light damage) and PreCancerous Actinic Keratoses by method of PhotoDynamic Therapy (PDT) and/or prescription Topical Chemotherapy agents such as 5-fluorouracil, 5-fluorouracil/calcipotriene, and/or imiquimod.  The purpose is to decrease the number of  clinically evident and subclinical PreCancerous lesions to prevent progression to development of skin cancer by chemically destroying early precancer changes that may or may not be visible.  It has been shown to reduce the risk of developing skin cancer in the treated area. As a result of treatment, redness, scaling, crusting, and open sores may occur during treatment course. One or more than one of these methods may be used and may have to be used several times to control, suppress and eliminate the PreCancerous changes. Discussed treatment course, expected reaction, and possible side effects. - Recommend daily broad spectrum sunscreen SPF 30+ to sun-exposed areas, reapply every 2 hours as needed.  - Staying in the shade or wearing long sleeves, sun glasses (UVA+UVB protection) and wide brim hats (4-inch brim around the entire circumference of the hat) are also recommended. - Call for new or changing lesions. Patient has Rx at home for 5FU/Vit D cream from [facility]. Start BID to face x 4-5 days, chest x 7 days.   Lentigines - Scattered tan macules - Due to sun exposure - Benign-appearing, observe - Recommend daily broad spectrum sunscreen SPF 30+ to sun-exposed areas, reapply every 2 hours as needed. - Call for any changes  Seborrheic Keratoses - Stuck-on, waxy, tan-Dinger papules and/or plaques  - Benign-appearing - Discussed benign etiology and prognosis. - Observe - Call for any changes  History of Basal Cell Carcinoma of the Skin - No evidence of recurrence today - Recommend regular full body skin exams - Recommend daily broad spectrum sunscreen SPF 30+ to sun-exposed areas, reapply every 2 hours as needed.  - Call if any new or changing lesions are noted between office visits  History  of Squamous Cell Carcinoma of the Skin - No evidence of recurrence today - Recommend regular full body skin exams - Recommend daily broad spectrum sunscreen SPF 30+ to sun-exposed areas, reapply  every 2 hours as needed.  - Call if any new or changing lesions are noted between office visits    History of SCC (squamous cell carcinoma) of skin right lateral cheek  Clear. Observe for recurrence. Call clinic for new or changing lesions.  Recommend regular skin exams, daily broad-spectrum spf 30+ sunscreen use, and photoprotection.    Hypertrophic scar Right lateral cheek, Right lower chest  Secondary to skin cancer treatments.   Discussed ILK injections, patient defers today, not bothersome.   Recommend Serica gel bid to scar of the right lateral cheek and R chest.   AK (actinic keratosis) (18) R temple x 1, spinal upper back x 1, R mid back x 2, L chest x 5, L antihelix x 1, L temple x 2, L zygoma x 1, forehead x 5,  Actinic keratoses are precancerous spots that appear secondary to cumulative UV radiation exposure/sun exposure over time. They are chronic with expected duration over 1 year. A portion of actinic keratoses will progress to squamous cell carcinoma of the skin. It is not possible to reliably predict which spots will progress to skin cancer and so treatment is recommended to prevent development of skin cancer.  Recommend daily broad spectrum sunscreen SPF 30+ to sun-exposed areas, reapply every 2 hours as needed.  Recommend staying in the shade or wearing long sleeves, sun glasses (UVA+UVB protection) and wide brim hats (4-inch brim around the entire circumference of the hat). Call for new or changing lesions.  Destruction of lesion - R temple x 1, spinal upper back x 1, R mid back x 2, L chest x 5, L antihelix x 1, L temple x 2, L zygoma x 1, forehead x 5,  Destruction method: cryotherapy   Informed consent: discussed and consent obtained   Lesion destroyed using liquid nitrogen: Yes   Region frozen until ice ball extended beyond lesion: Yes   Outcome: patient tolerated procedure well with no complications   Post-procedure details: wound care instructions given    Additional details:  Prior to procedure, discussed risks of blister formation, small wound, skin dyspigmentation, or rare scar following cryotherapy. Recommend Vaseline ointment to treated areas while healing.    Return in about 6 months (around 12/11/2022) for UBSE, Hx SCC, Hx BCC, f/up AKs.  IJamesetta Orleans, CMA, am acting as scribe for Brendolyn Patty, MD .  Documentation: I have reviewed the above documentation for accuracy and completeness, and I agree with the above.  Brendolyn Patty MD

## 2022-06-12 NOTE — Patient Instructions (Addendum)
Cryotherapy Aftercare  Wash gently with soap and water everyday.   Apply Vaseline and Band-Aid daily until healed.   - Start 5-fluorouracil/calcipotriene cream twice a day for 4-5 days to affected areas including forehead, temples, outside of cheeks. If you have enough, can be used on the chest twice a day x 7 days.  Reviewed course of treatment and expected reaction.  Patient advised to expect inflammation and crusting and advised that erosions are possible.  Patient advised to be diligent with sun protection during and after treatment. Counseled to keep medication out of reach of children and pets.   5-Fluorouracil/Calcipotriene Patient Education   Actinic keratoses are the dry, red scaly spots on the skin caused by sun damage. A portion of these spots can turn into skin cancer with time, and treating them can help prevent development of skin cancer.   Treatment of these spots requires removal of the defective skin cells. There are various ways to remove actinic keratoses, including freezing with liquid nitrogen, treatment with creams, or treatment with a blue light procedure in the office.   5-fluorouracil cream is a topical cream used to treat actinic keratoses. It works by interfering with the growth of abnormal fast-growing skin cells, such as actinic keratoses. These cells peel off and are replaced by healthy ones.   5-fluorouracil/calcipotriene is a combination of the 5-fluorouracil cream with a vitamin D analog cream called calcipotriene. The calcipotriene alone does not treat actinic keratoses. However, when it is combined with 5-fluorouracil, it helps the 5-fluorouracil treat the actinic keratoses much faster so that the same results can be achieved with a much shorter treatment time.  INSTRUCTIONS FOR 5-FLUOROURACIL/CALCIPOTRIENE CREAM:   5-fluorouracil/calcipotriene cream typically only needs to be used for 4-7 days. A thin layer should be applied twice a day to the treatment areas  recommended by your physician.   If your physician prescribed you separate tubes of 5-fluourouracil and calcipotriene, apply a thin layer of 5-fluorouracil followed by a thin layer of calcipotriene.   Avoid contact with your eyes, nostrils, and mouth. Do not use 5-fluorouracil/calcipotriene cream on infected or open wounds.   You will develop redness, irritation and some crusting at areas where you have pre-cancer damage/actinic keratoses. IF YOU DEVELOP PAIN, BLEEDING, OR SIGNIFICANT CRUSTING, STOP THE TREATMENT EARLY - you have already gotten a good response and the actinic keratoses should clear up well.  Wash your hands after applying 5-fluorouracil 5% cream on your skin.   A moisturizer or sunscreen with a minimum SPF 30 should be applied each morning.   Once you have finished the treatment, you can apply a thin layer of Vaseline twice a day to irritated areas to soothe and calm the areas more quickly. If you experience significant discomfort, contact your physician.  For some patients it is necessary to repeat the treatment for best results.  SIDE EFFECTS: When using 5-fluorouracil/calcipotriene cream, you may have mild irritation, such as redness, dryness, swelling, or a mild burning sensation. This usually resolves within 2 weeks. The more actinic keratoses you have, the more redness and inflammation you can expect during treatment. Eye irritation has been reported rarely. If this occurs, please let us know.  If you have any trouble using this cream, please call the office. If you have any other questions about this information, please do not hesitate to ask me before you leave the office.  Due to recent changes in healthcare laws, you may see results of your pathology and/or laboratory studies on MyChart  before the doctors have had a chance to review them. We understand that in some cases there may be results that are confusing or concerning to you. Please understand that not all results  are received at the same time and often the doctors may need to interpret multiple results in order to provide you with the best plan of care or course of treatment. Therefore, we ask that you please give [place] 2 business days to thoroughly review all your results before contacting the office for clarification. Should we see a critical lab result, you will be contacted sooner.   If You Need Anything After Your Visit  If you have any questions or concerns for your doctor, please call our main line at [phone] and press option 4 to reach your doctor's medical assistant. If no one answers, please leave a voicemail as directed and we will return your call as soon as possible. Messages left after 4 pm will be answered the following business day.   You may also send [place] a message via [place]. We typically respond to MyChart messages within 1-2 business days.  For prescription refills, please ask your pharmacy to contact our office. Our fax number is [phone].  If you have an urgent issue when the clinic is closed that cannot wait until the next business day, you can page your doctor at the number below.    Please note that while we do our best to be available for urgent issues outside of office hours, we are not available 24/7.   If you have an urgent issue and are unable to reach [place], you may choose to seek medical care at your doctor's office, retail clinic, urgent care center, or emergency room.  If you have a medical emergency, please immediately call 911 or go to the emergency department.  Pager Numbers  - Dr. Nehemiah Massed: [phone]  - Dr. Laurence Ferrari: [phone]  - Dr. Nicole Kindred: [phone]  In the event of inclement weather, please call our main line at [phone] for an update on the status of any delays or closures.  Dermatology Medication Tips: Please keep the boxes that topical medications come in in order to help keep track of the instructions about where and how to use these.  Pharmacies typically print the medication instructions only on the boxes and not directly on the medication tubes.   If your medication is too expensive, please contact our office at [phone] option 4 or send [place] a message through [place].   We are unable to tell what your co-pay for medications will be in advance as this is different depending on your insurance coverage. However, we may be able to find a substitute medication at lower cost or fill out paperwork to get insurance to cover a needed medication.   If a prior authorization is required to get your medication covered by your insurance company, please allow [place] 1-2 business days to complete this process.  Drug prices often vary depending on where the prescription is filled and some pharmacies may offer cheaper prices.  The website www.goodrx.com contains coupons for medications through different pharmacies. The prices here do not account for what the cost may be with help from insurance (it may be cheaper with your insurance), but the website can give you the price if you did not use any insurance.  - You can print the associated coupon and take it with your prescription to the pharmacy.  - You may also stop by our office during regular business hours  and pick up a GoodRx coupon card.  - If you need your prescription sent electronically to a different pharmacy, notify our office through [facility] or by phone at [phone] option 4.     Si Usted Necesita Algo Despus de Su Visita  Tambin puede enviarnos un mensaje a travs de Pharmacist, community. Por lo general respondemos a los mensajes de MyChart en el transcurso de 1 a 2 das hbiles.  Para renovar recetas, por favor pida a su farmacia que se ponga en contacto con nuestra oficina. Harland Dingwall de fax es Gem [phone].  Si tiene un asunto urgente cuando la clnica est cerrada y que no puede esperar hasta el siguiente da hbil, puede llamar/localizar a su doctor(a) al nmero  que aparece a continuacin.   Por favor, tenga en cuenta que aunque hacemos todo lo posible para estar disponibles para asuntos urgentes fuera del horario de [place], no estamos disponibles las 24 horas del da, los 7 das de la [place].   Si tiene un problema urgente y no puede comunicarse con nosotros, puede optar por buscar atencin mdica  en el consultorio de su doctor(a), en una clnica privada, en un centro de atencin urgente o en una sala de emergencias.  Si tiene Engineering geologist, por favor llame inmediatamente al 911 o vaya a la sala de emergencias.  Nmeros de bper  - Dr. Nehemiah Massed: [phone]  - Dra. Moye: [phone]  - Dra. Nicole Kindred: [phone]  En caso de inclemencias del [place], por favor llame a Johnsie Kindred principal al [phone] para una actualizacin sobre el [place] de cualquier retraso o cierre.  Consejos para la medicacin en dermatologa: Por favor, guarde las cajas en las que vienen los medicamentos de uso tpico para ayudarle a seguir las instrucciones sobre dnde y cmo usarlos. Las farmacias generalmente imprimen las instrucciones del medicamento slo en las cajas y no directamente en los tubos del [place].   Si su medicamento es muy caro, por favor, pngase en contacto con Zigmund Daniel llamando al [phone] y presione la opcin 4 o envenos un mensaje a travs de Pharmacist, community.   No podemos decirle cul ser su copago por los medicamentos por adelantado ya que esto es diferente dependiendo de la cobertura de su seguro. Sin embargo, es posible que podamos encontrar un medicamento sustituto a Electrical engineer un formulario para que el seguro cubra el medicamento que se considera necesario.   Si se requiere una autorizacin previa para que su compaa de seguros [place] su medicamento, por favor permtanos de 1 a 2 das hbiles para completar este proceso.  Los precios de los medicamentos varan con frecuencia dependiendo del Environmental consultant de dnde se  surte la receta y alguna farmacias pueden ofrecer precios ms baratos.  El sitio web www.goodrx.com tiene cupones para medicamentos de Airline pilot. Los precios aqu no tienen en cuenta lo que podra costar con la ayuda del seguro (puede ser ms barato con su seguro), pero el sitio web puede darle el precio si no utiliz Research scientist (physical sciences).  - Puede imprimir el cupn correspondiente y llevarlo con su receta a la farmacia.  - Tambin puede pasar por nuestra oficina durante el horario de atencin regular y Charity fundraiser una tarjeta de cupones de GoodRx.  - Si necesita que su receta se enve electrnicamente a una farmacia diferente, informe a nuestra oficina a travs de MyChart de [place] o por telfono llamando al [phone] y presione la opcin 4.

## 2022-06-13 ENCOUNTER — Other Ambulatory Visit: Payer: Self-pay | Admitting: Student in an Organized Health Care Education/Training Program

## 2022-08-07 ENCOUNTER — Encounter: Payer: Self-pay | Admitting: Family

## 2022-08-07 ENCOUNTER — Ambulatory Visit (INDEPENDENT_AMBULATORY_CARE_PROVIDER_SITE_OTHER): Payer: 59 | Admitting: Family

## 2022-08-07 VITALS — BP 118/70 | HR 64 | Temp 98.1°F | Ht 71.0 in | Wt 221.4 lb

## 2022-08-07 DIAGNOSIS — Z7984 Long term (current) use of oral hypoglycemic drugs: Secondary | ICD-10-CM

## 2022-08-07 DIAGNOSIS — E119 Type 2 diabetes mellitus without complications: Secondary | ICD-10-CM

## 2022-08-07 DIAGNOSIS — R7309 Other abnormal glucose: Secondary | ICD-10-CM

## 2022-08-07 LAB — POCT GLYCOSYLATED HEMOGLOBIN (HGB A1C): Hemoglobin A1C: 7.2 % — AB (ref 4.0–5.6)

## 2022-08-07 MED ORDER — METFORMIN HCL ER 500 MG PO TB24: 500.0000 mg | ORAL_TABLET | Freq: Every evening | ORAL | 3 refills | Status: DC

## 2022-08-07 NOTE — Assessment & Plan Note (Signed)
Lab Results  Component Value Date   HGBA1C 7.2 (A) 08/07/2022   Slight improvement.  Patient agreeable to trial of metformin 500 mg daily.  Counseled on common side effects, mechanism of action.

## 2022-08-07 NOTE — Patient Instructions (Signed)
Start metformin 500 mg daily.  As discussed, you may experience loose stool on this medication.  Certainly let me know if is particular bothersome.    Nice to see you today.

## 2022-08-07 NOTE — Progress Notes (Signed)
   Assessment & Plan:  Diabetes mellitus without complication Assessment & Plan: Lab Results  Component Value Date   HGBA1C 7.2 (A) 08/07/2022   Slight improvement.  Patient agreeable to trial of metformin 500 mg daily.  Counseled on common side effects, mechanism of action.  Orders: -     metFORMIN HCl ER; Take 1 tablet (500 mg total) by mouth every evening.  Dispense: 90 tablet; Refill: 3  Elevated glucose -     POCT glycosylated hemoglobin (Hb A1C)     Return precautions given.   Risks, benefits, and alternatives of the medications and treatment plan prescribed today were discussed, and patient expressed understanding.   Education regarding symptom management and diagnosis given to patient on AVS either electronically or printed.  Return in about 3 months (around 11/06/2022) for Complete Physical Exam.  [name], FNP  Subjective:    Patient ID: Wayne Sanford, male    DOB: November 08, 1970, 52 y.o.   MRN: [id number]  CC: Wayne Sanford is a 52 y.o. male who presents today for follow up.   HPI: Feels well today.  No new complaints.  Denies chest pain shortness of breath. He is looking forward to a  trip to [place], [place], for [place] week. He endorses dietary indiscretion.    Allergies: Patient has no known allergies. No current outpatient medications on file prior to visit.   No current facility-administered medications on file prior to visit.    Review of Systems  Constitutional:  Negative for chills and fever.  Respiratory:  Negative for cough.   Cardiovascular:  Negative for chest pain and palpitations.  Gastrointestinal:  Negative for nausea and vomiting.      Objective:    BP 118/70   Pulse 64   Temp 98.1 F (36.7 C) (Oral)   Ht  (1.803 m)   Wt 221 lb 6.4 oz (100.4 kg)   SpO2 98%   BMI 30.88 kg/m  BP Readings from Last 3 Encounters:  08/07/22 118/70  06/12/22 118/76  05/08/22 124/78   Wt Readings from Last 3 Encounters:  08/07/22 221  lb 6.4 oz (100.4 kg)  05/08/22 222 lb (100.7 kg)  03/27/22 222 lb 4 oz (100.8 kg)    Physical Exam Vitals reviewed.  Constitutional:      Appearance: He is well-developed.  Cardiovascular:     Rate and Rhythm: Regular rhythm.     Heart sounds: Normal heart sounds.  Pulmonary:     Effort: Pulmonary effort is normal. No respiratory distress.     Breath sounds: Normal breath sounds. No wheezing, rhonchi or rales.  Skin:    General: Skin is warm and dry.  Neurological:     Mental Status: He is alert.  Psychiatric:        Speech: Speech normal.        Behavior: Behavior normal.

## 2022-11-13 ENCOUNTER — Encounter: Payer: Self-pay | Admitting: Family

## 2022-11-13 ENCOUNTER — Ambulatory Visit (INDEPENDENT_AMBULATORY_CARE_PROVIDER_SITE_OTHER): Payer: 59 | Admitting: Family

## 2022-11-13 VITALS — BP 122/82 | HR 95 | Temp 98.0°F | Ht 71.0 in | Wt 216.4 lb

## 2022-11-13 DIAGNOSIS — E119 Type 2 diabetes mellitus without complications: Secondary | ICD-10-CM

## 2022-11-13 DIAGNOSIS — Z13220 Encounter for screening for lipoid disorders: Secondary | ICD-10-CM

## 2022-11-13 DIAGNOSIS — R7309 Other abnormal glucose: Secondary | ICD-10-CM

## 2022-11-13 DIAGNOSIS — Z0000 Encounter for general adult medical examination without abnormal findings: Secondary | ICD-10-CM | POA: Diagnosis not present

## 2022-11-13 DIAGNOSIS — Z8639 Personal history of other endocrine, nutritional and metabolic disease: Secondary | ICD-10-CM

## 2022-11-13 DIAGNOSIS — Z136 Encounter for screening for cardiovascular disorders: Secondary | ICD-10-CM

## 2022-11-13 DIAGNOSIS — Z7984 Long term (current) use of oral hypoglycemic drugs: Secondary | ICD-10-CM

## 2022-11-13 LAB — COMPREHENSIVE METABOLIC PANEL
ALT: 23 U/L (ref 0–53)
AST: 20 U/L (ref 0–37)
Albumin: 4.3 g/dL (ref 3.5–5.2)
Alkaline Phosphatase: 77 U/L (ref 39–117)
BUN: 18 mg/dL (ref 6–23)
CO2: 26 mEq/L (ref 19–32)
Calcium: 9.9 mg/dL (ref 8.4–10.5)
Chloride: 102 mEq/L (ref 96–112)
Creatinine, Ser: 1.16 mg/dL (ref 0.40–1.50)
GFR: 72.33 mL/min (ref >60.00)
Glucose, Bld: 166 mg/dL — ABNORMAL HIGH (ref 70–99)
Potassium: 4.8 mEq/L (ref 3.5–5.1)
Sodium: 137 mEq/L (ref 135–145)
Total Bilirubin: 0.8 mg/dL (ref 0.2–1.2)
Total Protein: 6.8 g/dL (ref 6.0–8.3)

## 2022-11-13 LAB — POCT GLYCOSYLATED HEMOGLOBIN (HGB A1C): Hemoglobin A1C: 7.2 % — AB (ref 4.0–5.6)

## 2022-11-13 LAB — LIPID PANEL
Cholesterol: 164 mg/dL (ref 0–200)
HDL: 48.9 mg/dL (ref >39.00)
LDL Cholesterol: 92 mg/dL (ref 0–99)
NonHDL: 115.55
Total CHOL/HDL Ratio: 3
Triglycerides: 117 mg/dL (ref 0.0–149.0)
VLDL: 23.4 mg/dL (ref 0.0–40.0)

## 2022-11-13 LAB — VITAMIN D 25 HYDROXY (VIT D DEFICIENCY, FRACTURES): VITD: 28.7 ng/mL — ABNORMAL LOW (ref 30.00–100.00)

## 2022-11-13 LAB — MICROALBUMIN / CREATININE URINE RATIO
Creatinine,U: 113.4 mg/dL
Microalb Creat Ratio: 0.6 mg/g (ref 0.0–30.0)
Microalb, Ur: <0.7 mg/dL (ref 0.0–1.9)

## 2022-11-13 NOTE — Progress Notes (Signed)
Assessment & Plan:  Elevated glucose -     POCT glycosylated hemoglobin (Hb A1C)  Diabetes mellitus without complication [facility]) Assessment & Plan: Lab Results  Component Value Date   HGBA1C 7.2 (A) 11/13/2022   Stable.  Discussed reducing carbohydrates, sugar in diet.  Patient prefers to continue metformin 500 mg daily and will consider increasing to metformin 1000 mg daily.    Orders: -     Comprehensive metabolic panel -     Microalbumin / creatinine urine ratio -     Lipid panel  History of vitamin D deficiency -     VITAMIN D 25 Hydroxy (Vit-D Deficiency, Fractures)  Encounter for lipid screening for cardiovascular disease  Annual physical exam Assessment & Plan:  He politely declines screening PSA this year.  He would like to do every couple of years.  He declines pneumonia vaccine at this time.  We discussed relevant and past history of abnormal labs today at length, we decided to order appropriate labs for screening today based on this discussion. Patient was comfortable with this. Patient was advised if any symptoms were to change after today's visit, to notify me as we may always order additional labs. He is overdue for repeat 74-month interval CT chest with pulmonology.  I have reviewed Dr Velora Heckler note with him.  Patient will call [place] pulmonology to schedule this test.  He understands he will have an annual CT chest as well under Kandice Robinsons.        Return precautions given.   Risks, benefits, and alternatives of the medications and treatment plan prescribed today were discussed, and patient expressed understanding.   Education regarding symptom management and diagnosis given to patient on AVS either electronically or printed.  Return in about 3 months (around 02/13/2023).  [name], FNP  Subjective:    Patient ID: Wayne Sanford, male    DOB: 1970/08/18, 52 y.o.   MRN: [id number]  CC: Wayne Sanford is a 52 y.o. male who presents today for  physical exam.    HPI: Feels well today  No new complaints     History of basal cell and squamous cell carcinoma.  He follows with dermatology q 6 mos.   Colorectal  Cancer Screening: UTD , 08/31/21; Dr Tobi Bastos.  Repeat in 3 years time Prostate Cancer Screening: Denies urinary hesitancy, decreased urine stream.  No history of prostate cancer in the family.  He politely declines screening PSA this year.  He would like to do every couple of years.   Lung Cancer Screening: He is following with pulmonology, previous CT chest 02/02/2022; previously seen Dr. Beatris Ship 03/27/2022, status post left lower lung nodule, biopsy negative for malignancy.  Repeat CT chest in 6 months time  Immunizations       Tetanus - UTD  Pneumonia vaccine- Candidate for; he declines   Alcohol use:  none Smoking/tobacco use: former smoker, quit 2022  Health Maintenance  Topic Date Due   COVID-19 Vaccine (1) Never done   Eye exam for diabetics  Never done   Flu Shot  11/16/2022   Yearly kidney function blood test for diabetes  02/04/2023   Yearly kidney health urinalysis for diabetes  02/04/2023   Complete foot exam   02/04/2023   Screening for Lung Cancer  03/15/2023   Hemoglobin A1C  05/16/2023   DTaP/Tdap/Td vaccine (2 - Td or Tdap) 11/30/2023   Colon Cancer Screening  09/01/2024   Hepatitis C Screening  Completed   HIV  Screening  Completed   Zoster (Shingles) Vaccine  Completed   HPV Vaccine  Aged Out     ALLERGIES: Patient has no known allergies.  Current Outpatient Medications on File Prior to Visit  Medication Sig Dispense Refill   metFORMIN (GLUCOPHAGE-XR) 500 MG 24 hr tablet Take 1 tablet (500 mg total) by mouth every evening. 90 tablet 3   No current facility-administered medications on file prior to visit.    Review of Systems  Constitutional:  Negative for chills and fever.  Respiratory:  Negative for cough.   Cardiovascular:  Negative for chest pain and palpitations.  Gastrointestinal:   Negative for nausea and vomiting.  Genitourinary:  Negative for difficulty urinating and urgency.      Objective:    BP 122/82   Pulse 95   Temp 98 F (36.7 C) (Oral)   Ht 5\' 11"  (1.803 m)   Wt 216 lb 6.4 oz (98.2 kg)   SpO2 98%   BMI 30.18 kg/m   BP Readings from Last 3 Encounters:  11/13/22 122/82  08/07/22 118/70  06/12/22 118/76   Wt Readings from Last 3 Encounters:  11/13/22 216 lb 6.4 oz (98.2 kg)  08/07/22 221 lb 6.4 oz (100.4 kg)  05/08/22 222 lb (100.7 kg)    Physical Exam Vitals reviewed.  Constitutional:      Appearance: He is well-developed.  Neck:     Thyroid: No thyroid mass or thyromegaly.  Cardiovascular:     Rate and Rhythm: Regular rhythm.     Heart sounds: Normal heart sounds.  Pulmonary:     Effort: Pulmonary effort is normal. No respiratory distress.     Breath sounds: Normal breath sounds. No wheezing, rhonchi or rales.  Lymphadenopathy:     Head:     Right side of head: No submental, submandibular, tonsillar, preauricular, posterior auricular or occipital adenopathy.     Left side of head: No submental, submandibular, tonsillar, preauricular, posterior auricular or occipital adenopathy.     Cervical: No cervical adenopathy.  Skin:    General: Skin is warm and dry.  Neurological:     Mental Status: He is alert.  Psychiatric:        Speech: Speech normal.        Behavior: Behavior normal.

## 2022-11-13 NOTE — Assessment & Plan Note (Addendum)
He politely declines screening PSA this year.  He would like to do every couple of years.  He declines pneumonia vaccine at this time.  We discussed relevant and past history of abnormal labs today at length, we decided to order appropriate labs for screening today based on this discussion. Patient was comfortable with this. Patient was advised if any symptoms were to change after today's visit, to notify me as we may always order additional labs. He is overdue for repeat 27-month interval CT chest with pulmonology.  I have reviewed Dr Velora Heckler note with him.  Patient will call [place] pulmonology to schedule this test.  He understands he will have an annual CT chest as well under Kandice Robinsons.

## 2022-11-13 NOTE — Patient Instructions (Addendum)
You are overdue for repeat CT chest.  You are technically due for CT in April of this year.  Please call [facility], Dr Velora Heckler office to schedule this test.  Please let me know if any issues in scheduling  [phone]   Health Maintenance, Male Adopting a healthy lifestyle and getting preventive care are important in promoting health and wellness. Ask your health care provider about: The right schedule for you to have regular tests and exams. Things you can do on your own to prevent diseases and keep yourself healthy. What should I know about diet, weight, and exercise? Eat a healthy diet  Eat a diet that includes plenty of vegetables, fruits, low-fat dairy products, and lean protein. Do not eat a lot of foods that are high in solid fats, added sugars, or sodium. Maintain a healthy weight Body mass index (BMI) is a measurement that can be used to identify possible weight problems. It estimates body fat based on height and weight. Your health care provider can help determine your BMI and help you achieve or maintain a healthy weight. Get regular exercise Get regular exercise. This is one of the most important things you can do for your health. Most adults should: Exercise for at least 150 minutes each week. The exercise should increase your heart rate and make you sweat (moderate-intensity exercise). Do strengthening exercises at least twice a week. This is in addition to the moderate-intensity exercise. Spend less time sitting. Even light physical activity can be beneficial. Watch cholesterol and blood lipids Have your blood tested for lipids and cholesterol at 53 years of age, then have this test every 5 years. You may need to have your cholesterol levels checked more often if: Your lipid or cholesterol levels are high. You are older than 53 years of age. You are at high risk for heart disease. What should I know about cancer screening? Many types of cancers can be detected  early and may often be prevented. Depending on your health history and family history, you may need to have cancer screening at various ages. This may include screening for: Colorectal cancer. Prostate cancer. Skin cancer. Lung cancer. What should I know about heart disease, diabetes, and high blood pressure? Blood pressure and heart disease High blood pressure causes heart disease and increases the risk of stroke. This is more likely to develop in people who have high blood pressure readings or are overweight. Talk with your health care provider about your target blood pressure readings. Have your blood pressure checked: Every 3-5 years if you are 50-53 years of age. Every year if you are 61 years old or older. If you are between the ages of 83 and 45 and are a current or former smoker, ask your health care provider if you should have a one-time screening for abdominal aortic aneurysm (AAA). Diabetes Have regular diabetes screenings. This checks your fasting blood sugar level. Have the screening done: Once every three years after age 58 if you are at a normal weight and have a low risk for diabetes. More often and at a younger age if you are overweight or have a high risk for diabetes. What should I know about preventing infection? Hepatitis B If you have a higher risk for hepatitis B, you should be screened for this virus. Talk with your health care provider to find out if you are at risk for hepatitis B infection. Hepatitis C Blood testing is recommended for: Everyone born from [birth year] through 1966. Anyone  with known risk factors for hepatitis C. Sexually transmitted infections (STIs) You should be screened each year for STIs, including gonorrhea and chlamydia, if: You are sexually active and are younger than 53 years of age. You are older than 53 years of age and your health care provider tells you that you are at risk for this type of infection. Your sexual activity has changed since  you were last screened, and you are at increased risk for chlamydia or gonorrhea. Ask your health care provider if you are at risk. Ask your health care provider about whether you are at high risk for HIV. Your health care provider may recommend a prescription medicine to help prevent HIV infection. If you choose to take medicine to prevent HIV, you should first get tested for HIV. You should then be tested every 3 months for as long as you are taking the medicine. Follow these instructions at home: Alcohol use Do not drink alcohol if your health care provider tells you not to drink. If you drink alcohol: Limit how much you have to 0-2 drinks a day. Know how much alcohol is in your drink. In the [place] one drink equals one 12 oz bottle of beer (355 mL), one 5 oz glass of wine (148 mL), or one 1 oz glass of hard liquor (44 mL). Lifestyle Do not use any products that contain nicotine or tobacco. These products include cigarettes, chewing tobacco, and vaping devices, such as e-cigarettes. If you need help quitting, ask your health care provider. Do not use street drugs. Do not share needles. Ask your health care provider for help if you need support or information about quitting drugs. General instructions Schedule regular health, dental, and eye exams. Stay current with your vaccines. Tell your health care provider if: You often feel depressed. You have ever been abused or do not feel safe at home. Summary Adopting a healthy lifestyle and getting preventive care are important in promoting health and wellness. Follow your health care provider's instructions about healthy diet, exercising, and getting tested or screened for diseases. Follow your health care provider's instructions on monitoring your cholesterol and blood pressure. This information is not intended to replace advice given to you by your health care provider. Make sure you discuss any questions you have with your health care  provider. Document Revised: 08/23/2020 Document Reviewed: 08/23/2020 Elsevier Patient Education  2024 [employer].

## 2022-11-13 NOTE — Assessment & Plan Note (Signed)
Lab Results  Component Value Date   HGBA1C 7.2 (A) 11/13/2022   Stable.  Discussed reducing carbohydrates, sugar in diet.  Patient prefers to continue metformin 500 mg daily and will consider increasing to metformin 1000 mg daily.

## 2022-11-24 ENCOUNTER — Telehealth: Payer: Self-pay

## 2022-11-24 NOTE — Telephone Encounter (Signed)
LVM to call back to go over results 

## 2022-11-27 ENCOUNTER — Telehealth: Payer: Self-pay

## 2022-11-27 NOTE — Telephone Encounter (Signed)
LVM to call back to go over results 

## 2022-12-01 NOTE — Telephone Encounter (Signed)
Left detailed Vm informing pt of results that were sent by pcp via mycrat , also informed pt a letter will be mailed to him as this has been the 3rd attempt to contact pt (see lab tab to see prior attempts by Alric Ran cma) :    Wayne Sanford,   In the setting of diabetes, in particular as diabetes increases risk for stroke and heart attack, we are more aggressive with cholesterol management.  I recommend achieving an LDL less than 70.  Since yours is 3, I would recommend Crestor 5 mg daily which is a statin.  Please let me know if you would be agreeable to medication.  We will most certainly follow your cholesterol annually.     Vitamin D is slightly low. Please start cholecalciferol 800 units daily. You may find this over the counter in the drug store.   Please call to arrange follow up with me a few months and request vitamin d to be drawn then.   Please ensure you are following a diet high in calcium -- research shows better outcomes with dietary sources including kale, yogurt, broccolii, cheese, okra, almonds- to name a few.           Regards, Claris Che

## 2023-01-24 ENCOUNTER — Encounter: Payer: Self-pay | Admitting: Dermatology

## 2023-01-24 ENCOUNTER — Ambulatory Visit: Payer: 59 | Admitting: Dermatology

## 2023-01-24 VITALS — BP 117/72

## 2023-01-24 DIAGNOSIS — Z1283 Encounter for screening for malignant neoplasm of skin: Secondary | ICD-10-CM

## 2023-01-24 DIAGNOSIS — L578 Other skin changes due to chronic exposure to nonionizing radiation: Secondary | ICD-10-CM | POA: Diagnosis not present

## 2023-01-24 DIAGNOSIS — D1801 Hemangioma of skin and subcutaneous tissue: Secondary | ICD-10-CM

## 2023-01-24 DIAGNOSIS — D229 Melanocytic nevi, unspecified: Secondary | ICD-10-CM

## 2023-01-24 DIAGNOSIS — L905 Scar conditions and fibrosis of skin: Secondary | ICD-10-CM | POA: Diagnosis not present

## 2023-01-24 DIAGNOSIS — Z872 Personal history of diseases of the skin and subcutaneous tissue: Secondary | ICD-10-CM

## 2023-01-24 DIAGNOSIS — Z5111 Encounter for antineoplastic chemotherapy: Secondary | ICD-10-CM

## 2023-01-24 DIAGNOSIS — L814 Other melanin hyperpigmentation: Secondary | ICD-10-CM

## 2023-01-24 DIAGNOSIS — Z86007 Personal history of in-situ neoplasm of skin: Secondary | ICD-10-CM

## 2023-01-24 DIAGNOSIS — L570 Actinic keratosis: Secondary | ICD-10-CM

## 2023-01-24 DIAGNOSIS — W908XXA Exposure to other nonionizing radiation, initial encounter: Secondary | ICD-10-CM

## 2023-01-24 DIAGNOSIS — Z8589 Personal history of malignant neoplasm of other organs and systems: Secondary | ICD-10-CM

## 2023-01-24 DIAGNOSIS — Z7189 Other specified counseling: Secondary | ICD-10-CM

## 2023-01-24 DIAGNOSIS — L821 Other seborrheic keratosis: Secondary | ICD-10-CM

## 2023-01-24 DIAGNOSIS — Z79899 Other long term (current) drug therapy: Secondary | ICD-10-CM

## 2023-01-24 DIAGNOSIS — Z85828 Personal history of other malignant neoplasm of skin: Secondary | ICD-10-CM

## 2023-01-24 MED ORDER — FLUOROURACIL 5 % EX CREA: TOPICAL_CREAM | Freq: Two times a day (BID) | CUTANEOUS | 3 refills | Status: AC

## 2023-01-24 NOTE — Patient Instructions (Addendum)
Start 5-fluorouracil/calcipotriene cream twice a day for 4-7 days to affected areas including bil temples, bil cheeks, scalp.  Can treat each area separately.  Need to treat each area twice. Patient has prescription   Instructions for [facility] Medications  One or more of your medications was sent to the [facility] mail order compounding pharmacy. You will receive an email from them and can purchase the medicine through that link. It will then be mailed to your home at the address you confirmed. If for any reason you do not receive an email from them, please check your spam folder. If you still do not find the email, please let us know. [facility] phone number is [phone].    5-Fluorouracil/Calcipotriene Patient Education   Actinic keratoses are the dry, red scaly spots on the skin caused by sun damage. A portion of these spots can turn into skin cancer with time, and treating them can help prevent development of skin cancer.   Treatment of these spots requires removal of the defective skin cells. There are various ways to remove actinic keratoses, including freezing with liquid nitrogen, treatment with creams, or treatment with a blue light procedure in the office.   5-fluorouracil cream is a topical cream used to treat actinic keratoses. It works by interfering with the growth of abnormal fast-growing skin cells, such as actinic keratoses. These cells peel off and are replaced by healthy ones.   5-fluorouracil/calcipotriene is a combination of the 5-fluorouracil cream with a vitamin D analog cream called calcipotriene. The calcipotriene alone does not treat actinic keratoses. However, when it is combined with 5-fluorouracil, it helps the 5-fluorouracil treat the actinic keratoses much faster so that the same results can be achieved with a much shorter treatment time.  INSTRUCTIONS FOR 5-FLUOROURACIL/CALCIPOTRIENE CREAM:   5-fluorouracil/calcipotriene cream typically only  needs to be used for 4-7 days. A thin layer should be applied twice a day to the treatment areas recommended by your physician.   If your physician prescribed you separate tubes of 5-fluourouracil and calcipotriene, apply a thin layer of 5-fluorouracil followed by a thin layer of calcipotriene.   Avoid contact with your eyes, nostrils, and mouth. Do not use 5-fluorouracil/calcipotriene cream on infected or open wounds.   You will develop redness, irritation and some crusting at areas where you have pre-cancer damage/actinic keratoses. IF YOU DEVELOP PAIN, BLEEDING, OR SIGNIFICANT CRUSTING, STOP THE TREATMENT EARLY - you have already gotten a good response and the actinic keratoses should clear up well.  Wash your hands after applying 5-fluorouracil 5% cream on your skin.   A moisturizer or sunscreen with a minimum SPF 30 should be applied each morning.   Once you have finished the treatment, you can apply a thin layer of Vaseline twice a day to irritated areas to soothe and calm the areas more quickly. If you experience significant discomfort, contact your physician.  For some patients it is necessary to repeat the treatment for best results.  SIDE EFFECTS: When using 5-fluorouracil/calcipotriene cream, you may have mild irritation, such as redness, dryness, swelling, or a mild burning sensation. This usually resolves within 2 weeks. The more actinic keratoses you have, the more redness and inflammation you can expect during treatment. Eye irritation has been reported rarely. If this occurs, please let us know.  If you have any trouble using this cream, please call the office. If you have any other questions about this information, please do not hesitate to ask me before you leave the office.  Due to recent changes in healthcare laws, you may see results of your pathology and/or laboratory studies on MyChart before the doctors have had a chance to review them. We understand that in some cases  there may be results that are confusing or concerning to you. Please understand that not all results are received at the same time and often the doctors may need to interpret multiple results in order to provide you with the best plan of care or course of treatment. Therefore, we ask that you please give [place] 2 business days to thoroughly review all your results before contacting the office for clarification. Should we see a critical lab result, you will be contacted sooner.   If You Need Anything After Your Visit  If you have any questions or concerns for your doctor, please call our main line at [phone] and press option 4 to reach your doctor's medical assistant. If no one answers, please leave a voicemail as directed and we will return your call as soon as possible. Messages left after 4 pm will be answered the following business day.   You may also send [place] a message via MyChart. We typically respond to MyChart messages within 1-2 business days.  For prescription refills, please ask your pharmacy to contact our office. Our fax number is [phone].  If you have an urgent issue when the clinic is closed that cannot wait until the next business day, you can page your doctor at the number below.    Please note that while we do our best to be available for urgent issues outside of office hours, we are not available 24/7.   If you have an urgent issue and are unable to reach [place], you may choose to seek medical care at your doctor's office, retail clinic, urgent care center, or emergency room.  If you have a medical emergency, please immediately call 911 or go to the emergency department.  Pager Numbers  - Dr. Gwen Pounds: [phone]  - Dr. Roseanne Reno: [phone]  - Dr. Katrinka Blazing: [phone]   In the event of inclement weather, please call our main line at [phone] for an update on the status of any delays or closures.  Dermatology Medication Tips: Please keep the boxes that topical  medications come in in order to help keep track of the instructions about where and how to use these. Pharmacies typically print the medication instructions only on the boxes and not directly on the medication tubes.   If your medication is too expensive, please contact our office at [phone] option 4 or send [place] a message through MyChart.   We are unable to tell what your co-pay for medications will be in advance as this is different depending on your insurance coverage. However, we may be able to find a substitute medication at lower cost or fill out paperwork to get insurance to cover a needed medication.   If a prior authorization is required to get your medication covered by your insurance company, please allow [place] 1-2 business days to complete this process.  Drug prices often vary depending on where the prescription is filled and some pharmacies may offer cheaper prices.  The website www.goodrx.com contains coupons for medications through different pharmacies. The prices here do not account for what the cost may be with help from insurance (it may be cheaper with your insurance), but the website can give you the price if you did not use any insurance.  - You can print the associated coupon and take  it with your prescription to the pharmacy.  - You may also stop by our office during regular business hours and pick up a GoodRx coupon card.  - If you need your prescription sent electronically to a different pharmacy, notify our office through [facility] or by phone at [phone] option 4.     Si Usted Necesita Algo Despus de Su Visita  Tambin puede enviarnos un mensaje a travs de Clinical cytogeneticist. Por lo general respondemos a los mensajes de MyChart en el transcurso de 1 a 2 das hbiles.  Para renovar recetas, por favor pida a su farmacia que se ponga en contacto con nuestra oficina. Annie Sable de fax es [place] [phone].  Si tiene un asunto urgente cuando la clnica est  cerrada y que no puede esperar hasta el siguiente da hbil, puede llamar/localizar a su doctor(a) al nmero que aparece a continuacin.   Por favor, tenga en cuenta que aunque hacemos todo lo posible para estar disponibles para asuntos urgentes fuera del horario de [place], no estamos disponibles las 24 horas del da, los 7 [address]  [address] de la [place].   Si tiene un problema urgente y no puede comunicarse con nosotros, puede optar por buscar atencin mdica  en el consultorio de su doctor(a), en una clnica privada, en un centro de atencin urgente o en una sala de emergencias.  Si tiene Engineer, drilling, por favor llame inmediatamente al 911 o vaya a la sala de emergencias.  Nmeros de bper  - Dr. Gwen Pounds: [phone]  - Dra. Roseanne Reno: [phone]  - Dr. Katrinka Blazing: [phone]   En caso de inclemencias del tiempo, por favor llame a Lacy Duverney principal al [phone] para una actualizacin sobre el [place] de cualquier retraso o cierre.  Consejos para la medicacin en dermatologa: Por favor, guarde las cajas en las que vienen los medicamentos de uso tpico para ayudarle a seguir las instrucciones sobre dnde y cmo usarlos. Las farmacias generalmente imprimen las instrucciones del medicamento slo en las cajas y no directamente en los tubos del [place].   Si su medicamento es muy caro, por favor, pngase en contacto con Rolm Gala llamando al [phone] y presione la opcin 4 o envenos un mensaje a travs de Clinical cytogeneticist.   No podemos decirle cul ser su copago por los medicamentos por adelantado ya que esto es diferente dependiendo de la cobertura de su seguro. Sin embargo, es posible que podamos encontrar un medicamento sustituto a Audiological scientist un formulario para que el seguro cubra el medicamento que se considera necesario.   Si se requiere una autorizacin previa para que su compaa de seguros [place] su medicamento, por favor permtanos de 1 a 2 das hbiles para  completar [address]  Los precios de los medicamentos varan con frecuencia dependiendo del Environmental consultant de dnde se surte la receta y alguna farmacias pueden ofrecer precios ms baratos.  El sitio web www.goodrx.com tiene cupones para medicamentos de Health and safety inspector. Los precios aqu no tienen en cuenta lo que podra costar con la ayuda del seguro (puede ser ms barato con su seguro), pero el sitio web puede darle el precio si no utiliz Tourist information centre manager.  - Puede imprimir el cupn correspondiente y llevarlo con su receta a la farmacia.  - Tambin puede pasar por nuestra oficina durante el horario de atencin regular y Education officer, museum una tarjeta de cupones de GoodRx.  - Si necesita que su receta se enve electrnicamente a Psychiatrist, informe a nuestra oficina a travs de MyChart  de [facility] o por telfono llamando al [phone] y presione la opcin 4.

## 2023-01-24 NOTE — Progress Notes (Signed)
Follow-Up Visit   Subjective  Wayne Sanford is a 52 y.o. male who presents for the following: Skin Cancer Screening and Upper Body Skin Exam Hx of BCC, SCC, SCCIS, Aks, AK f/u, pt did not use 5FU/Calcipotriene   The patient presents for Upper Body Skin Exam (UBSE) for skin cancer screening and mole check. The patient has spots, moles and lesions to be evaluated, some may be new or changing and the patient may have concern these could be cancer.  The following portions of the chart were reviewed this encounter and updated as appropriate: medications, allergies, medical history  Review of Systems:  No other skin or systemic complaints except as noted in HPI or Assessment and Plan.  Objective  Well appearing patient in no apparent distress; mood and affect are within normal limits.  All skin waist up examined. Relevant physical exam findings are noted in the Assessment and Plan.  hands x 10 , scalp 11 (21) Pink scaly macules  scar      Assessment & Plan   AK (actinic keratosis) (21) hands x 10 , scalp 11  Actinic keratoses are precancerous spots that appear secondary to cumulative UV radiation exposure/sun exposure over time. They are chronic with expected duration over 1 year. A portion of actinic keratoses will progress to squamous cell carcinoma of the skin. It is not possible to reliably predict which spots will progress to skin cancer and so treatment is recommended to prevent development of skin cancer.  Recommend daily broad spectrum sunscreen SPF 30+ to sun-exposed areas, reapply every 2 hours as needed.  Recommend staying in the shade or wearing long sleeves, sun glasses (UVA+UVB protection) and wide brim hats (4-inch brim around the entire circumference of the hat). Call for new or changing lesions.  Destruction of lesion - hands x 10 , scalp 11 (21) Complexity: simple   Destruction method: cryotherapy   Informed consent: discussed and consent obtained   Timeout:   patient name, date of birth, surgical site, and procedure verified Lesion destroyed using liquid nitrogen: Yes   Region frozen until ice ball extended beyond lesion: Yes   Outcome: patient tolerated procedure well with no complications   Post-procedure details: wound care instructions given     Skin cancer screening performed today.  ACTINIC DAMAGE WITH PRECANCEROUS ACTINIC KERATOSES Counseling for Topical Chemotherapy Management: Patient exhibits: - Severe, confluent actinic changes with pre-cancerous actinic keratoses that is secondary to cumulative UV radiation exposure over time - Condition that is severe; chronic, not at goal. - diffuse scaly erythematous macules and papules with underlying dyspigmentation - Discussed Prescription "Field Treatment" topical Chemotherapy for Severe, Chronic Confluent Actinic Changes with Pre-Cancerous Actinic Keratoses Field treatment involves treatment of an entire area of skin that has confluent Actinic Changes (Sun/ Ultraviolet light damage) and PreCancerous Actinic Keratoses by method of PhotoDynamic Therapy (PDT) and/or prescription Topical Chemotherapy agents such as 5-fluorouracil, 5-fluorouracil/calcipotriene, and/or imiquimod.  The purpose is to decrease the number of clinically evident and subclinical PreCancerous lesions to prevent progression to development of skin cancer by chemically destroying early precancer changes that may or may not be visible.  It has been shown to reduce the risk of developing skin cancer in the treated area. As a result of treatment, redness, scaling, crusting, and open sores may occur during treatment course. One or more than one of these methods may be used and may have to be used several times to control, suppress and eliminate the PreCancerous changes. Discussed treatment course,  expected reaction, and possible side effects. - Recommend daily broad spectrum sunscreen SPF 30+ to sun-exposed areas, reapply every 2 hours as  needed.  - Staying in the shade or wearing long sleeves, sun glasses (UVA+UVB protection) and wide brim hats (4-inch brim around the entire circumference of the hat) are also recommended. - Call for new or changing lesions.  -- Start 5-fluorouracil/calcipotriene cream twice a day for 4-7 days to affected areas including bil temples, bil cheeks and scalp.  Treat each area twice.  May treat areas separately. Patient has prescription.  Patient provided with handout reviewing treatment course and side effects and advised to call or message [place] on MyChart with any concerns.  Reviewed course of treatment and expected reaction.  Patient advised to expect inflammation and crusting and advised that erosions are possible.  Patient advised to be diligent with sun protection during and after treatment. Counseled to keep medication out of reach of children and pets.   Lentigines, Seborrheic Keratoses, Hemangiomas - Benign normal skin lesions - Benign-appearing - Call for any changes  Melanocytic Nevi - Tan-Buckholtz and/or pink-flesh-colored symmetric macules and papules - Benign appearing on exam today - Observation - Call clinic for new or changing moles - Recommend daily use of broad spectrum spf 30+ sunscreen to sun-exposed areas.   HISTORY OF BASAL CELL CARCINOMA OF THE SKIN - No evidence of recurrence today - Recommend regular full body skin exams - Recommend daily broad spectrum sunscreen SPF 30+ to sun-exposed areas, reapply every 2 hours as needed.  - Call if any new or changing lesions are noted between office visits  - Multiple  HISTORY OF SQUAMOUS CELL CARCINOMA OF THE SKIN - No evidence of recurrence today - No lymphadenopathy - Recommend regular full body skin exams - Recommend daily broad spectrum sunscreen SPF 30+ to sun-exposed areas, reapply every 2 hours as needed.  - Call if any new or changing lesions are noted between office visits - R lat cheek  HISTORY OF SQUAMOUS CELL  CARCINOMA IN SITU OF THE SKIN - No evidence of recurrence today - Recommend regular full body skin exams - Recommend daily broad spectrum sunscreen SPF 30+ to sun-exposed areas, reapply every 2 hours as needed.  - Call if any new or changing lesions are noted between office visits  - L clavicle  SCAR Spinal mid back hx of BCC with recurrence in past - clear today Exam: scar spinal mid back, see photo Treatment: Benign-appearing.  Observation.  Call clinic for new or changing lesions. Recommend daily broad spectrum sunscreen SPF 30+, reapply every 2 hours as needed.   Return in about 6 months (around 07/25/2023) for AK f/u.  I, Ardis Rowan, RMA, am acting as scribe for Armida Sans, MD .   Documentation: I have reviewed the above documentation for accuracy and completeness, and I agree with the above.  Armida Sans, MD

## 2023-01-30 ENCOUNTER — Encounter: Payer: Self-pay | Admitting: Dermatology

## 2023-02-13 ENCOUNTER — Ambulatory Visit: Payer: 59 | Admitting: Family

## 2023-02-20 ENCOUNTER — Ambulatory Visit (INDEPENDENT_AMBULATORY_CARE_PROVIDER_SITE_OTHER): Payer: 59 | Admitting: Family

## 2023-02-20 VITALS — BP 118/80 | HR 75 | Temp 98.5°F | Ht 71.0 in | Wt 214.0 lb

## 2023-02-20 DIAGNOSIS — R911 Solitary pulmonary nodule: Secondary | ICD-10-CM | POA: Diagnosis not present

## 2023-02-20 DIAGNOSIS — E119 Type 2 diabetes mellitus without complications: Secondary | ICD-10-CM

## 2023-02-20 LAB — POCT GLYCOSYLATED HEMOGLOBIN (HGB A1C): Hemoglobin A1C: 6.4 % — AB (ref 4.0–5.6)

## 2023-02-20 NOTE — Patient Instructions (Addendum)
We discussed CT calcium score ( SELF PAY option)  to further stratify your overall cardiovascular risk .   An estimate of cost is $150-200 out-of-pocket as not covered by insurance. However please call your insurance company in advance to ensure no other costs so that you do not have any unexpected bills.    Please let me know if he would like to pursue.    Below an article from [facility] regarding the test.   [email or web address]/exams-[email or web address]-[email or web address]-ct#:~:[email or web address]%20CT%20calcium%20score,arteries%20can%20cause%20heart%20attacks.   Exams We Offer: Cardiac CT Calcium Score  Knowing your score could save your life. A cardiac CT calcium score, also known as a coronary calcium scan, is a quick, convenient and noninvasive way of evaluating the amount of calcified (hard) plaque in your heart vessels. The level of calcium equates to the extent of plaque build-up in your arteries. Plaque in the arteries can cause heart attacks.  The radiologist reads the images and sends your doctor a report with a calcium score. Patients with higher scores have a greater risk for a heart attack, heart disease or stroke. Knowing your score can help your doctor decide on blood pressure and cholesterol goals that will minimize your risk as much as possible.  The [employer] of [employer] found that Coronary artery calcification (CAC) is an excellent cardiovascular disease risk marker and can help guide the decision to use cholesterol reducing medications such as statins. A negative calcium score may reduce the need for statins in otherwise eligible patients.  The exam takes less than 10 minutes, is painless and does not require any IV or oral contrast. At [facility] locations, the out-of-pocket fee without insurance is $75. At the time of scheduling, please let us know if you want to process the exam through your insurance or self-pay at the  rate of $75. Patients who want to self-pay should not submit their insurance card when checking in for the appointment.   Who should get a Cardiac CT Calcium Score: Middle age adults at intermediate risk of heart disease Family history of heart disease Borderline high cholesterol, high blood pressure or diabetes Overweight or physical inactivity Uncertain about taking daily preventive medical therapy

## 2023-02-20 NOTE — Progress Notes (Signed)
Assessment & Plan:  Diabetes mellitus without complication [facility]) Assessment & Plan: Lab Results  Component Value Date   HGBA1C 6.4 (A) 02/20/2023   Lab Results  Component Value Date   LDLCALC 92 11/13/2022   The 10-year ASCVD risk score (Wayne Sanford DK, et al., 2019) is: 5.5% Discussed LDL goal being less than 70 in the setting of diabetes and to reduce ASCVD risk.  We discussed pursuing CT calcium score to further stratify his risk.  Family history unknown as he is adopted.  He will consider this test.  Will obtain lipid panel at follow-up . Excellent diabetic control.  Continue metformin 1000 mg qd   Orders: -     POCT glycosylated hemoglobin (Hb A1C)  Lung nodule Assessment & Plan: Discussed patient overdue for his CT chest.  Patient will contact Dr. Bryn Gulling office and aware of MyChart message to schedule CT chest.       Return precautions given.   Risks, benefits, and alternatives of the medications and treatment plan prescribed today were discussed, and patient expressed understanding.   Education regarding symptom management and diagnosis given to patient on AVS either electronically or printed.  Return in about 4 months (around 06/20/2023).  [name], FNP  Subjective:    Patient ID: Wayne Sanford, male    DOB: [birth year], 52 y.o.   MRN: [id number]  CC: [name] is a 52 y.o. male who presents today for follow up.   HPI: Feels well today.  No new complaints.  He is overall pleased with metformin taking 1000 mg every day.      He has been contacted by pulmonology in regards to scheduling follow-up CT chest.  History of pulmonary nodule   Allergies: Patient has no known allergies. Current Outpatient Medications on File Prior to Visit  Medication Sig Dispense Refill   fluorouracil (EFUDEX) 5 % cream Apply topically 2 (two) times daily. Bid to temples, cheeks, and scalp for 4 to 7 days, then wait a month and repeat 30 g 3   metFORMIN (GLUCOPHAGE-XR)  500 MG 24 hr tablet Take 1 tablet (500 mg total) by mouth every evening. 90 tablet 3   No current facility-administered medications on file prior to visit.    Review of Systems  Constitutional:  Negative for chills and fever.  Respiratory:  Negative for cough.   Cardiovascular:  Negative for chest pain and palpitations.  Gastrointestinal:  Negative for nausea and vomiting.      Objective:    BP 118/80   Pulse 75   Temp 98.5 F (36.9 C) (Oral)   Ht 5\' 11"  (1.803 m)   Wt 214 lb (97.1 kg)   SpO2 98%   BMI 29.85 kg/m  BP Readings from Last 3 Encounters:  02/20/23 118/80  01/24/23 117/72  11/13/22 122/82   Wt Readings from Last 3 Encounters:  02/20/23 214 lb (97.1 kg)  11/13/22 216 lb 6.4 oz (98.2 kg)  08/07/22 221 lb 6.4 oz (100.4 kg)    Physical Exam Vitals reviewed.  Constitutional:      Appearance: He is well-developed.  Cardiovascular:     Rate and Rhythm: Regular rhythm.     Heart sounds: Normal heart sounds.  Pulmonary:     Effort: Pulmonary effort is normal. No respiratory distress.     Breath sounds: Normal breath sounds. No wheezing, rhonchi or rales.  Skin:    General: Skin is warm and dry.  Neurological:     Mental Status: He is  alert.  Psychiatric:        Speech: Speech normal.        Behavior: Behavior normal.

## 2023-02-20 NOTE — Assessment & Plan Note (Addendum)
Lab Results  Component Value Date   HGBA1C 6.4 (A) 02/20/2023   Lab Results  Component Value Date   LDLCALC 92 11/13/2022   The 10-year ASCVD risk score (Turon Kilmer DK, et al., 2019) is: 5.5% Discussed LDL goal being less than 70 in the setting of diabetes and to reduce ASCVD risk.  We discussed pursuing CT calcium score to further stratify his risk.  Family history unknown as he is adopted.  He will consider this test.  Will obtain lipid panel at follow-up . Excellent diabetic control.  Continue metformin 1000 mg qd

## 2023-02-20 NOTE — Assessment & Plan Note (Signed)
Discussed patient overdue for his CT chest.  Patient will contact Dr. Bryn Gulling office and aware of MyChart message to schedule CT chest.

## 2023-03-19 ENCOUNTER — Other Ambulatory Visit: Payer: Self-pay | Admitting: Family

## 2023-03-19 ENCOUNTER — Encounter: Payer: Self-pay | Admitting: Family

## 2023-03-19 DIAGNOSIS — E119 Type 2 diabetes mellitus without complications: Secondary | ICD-10-CM

## 2023-03-19 MED ORDER — METFORMIN HCL ER 500 MG PO TB24: 1000.0000 mg | ORAL_TABLET | Freq: Every evening | ORAL | 3 refills | Status: DC

## 2023-03-19 NOTE — Telephone Encounter (Signed)
Old prescription has been pended.

## 2023-03-21 ENCOUNTER — Ambulatory Visit: Payer: 59

## 2023-06-20 ENCOUNTER — Encounter: Payer: Self-pay | Admitting: Family

## 2023-06-20 ENCOUNTER — Ambulatory Visit: Payer: 59 | Admitting: Family

## 2023-06-20 VITALS — BP 120/78 | HR 78 | Temp 98.2°F | Ht 71.0 in | Wt 213.4 lb

## 2023-06-20 DIAGNOSIS — R7309 Other abnormal glucose: Secondary | ICD-10-CM

## 2023-06-20 DIAGNOSIS — Z7984 Long term (current) use of oral hypoglycemic drugs: Secondary | ICD-10-CM

## 2023-06-20 DIAGNOSIS — Z7985 Long-term (current) use of injectable non-insulin antidiabetic drugs: Secondary | ICD-10-CM

## 2023-06-20 DIAGNOSIS — E119 Type 2 diabetes mellitus without complications: Secondary | ICD-10-CM

## 2023-06-20 LAB — POCT GLYCOSYLATED HEMOGLOBIN (HGB A1C): Hemoglobin A1C: 6.6 % — AB (ref 4.0–5.6)

## 2023-06-20 MED ORDER — SEMAGLUTIDE(0.25 OR 0.5MG/DOS) 2 MG/3ML ~~LOC~~ SOPN: 0.2500 mg | PEN_INJECTOR | SUBCUTANEOUS | 2 refills | Status: DC

## 2023-06-20 NOTE — Assessment & Plan Note (Addendum)
 Chronic, stable.  Interested in Ozempic to aid in weight loss.  Counseled on side effects, titration, MOA, and blackbox warning as it relates to medullary thyroid cancer, endocrine neoplasia.  He was adopted.  Trial of Ozempic.  Advised he may continue metformin 1000 mg daily or may stop metformin if he prefers to be on Ozempic only.  Counseled on low glycemic diet, creating caloric deficit

## 2023-06-20 NOTE — Progress Notes (Signed)
 Assessment & Plan:  Diabetes mellitus without complication (HCC) Assessment & Plan: Chronic, stable.  Interested in Ozempic to aid in weight loss.  Counseled on side effects, titration, MOA, and blackbox warning as it relates to medullary thyroid cancer, endocrine neoplasia.  He was adopted.  Trial of Ozempic.  Advised he may continue metformin 1000 mg daily or may stop metformin if he prefers to be on Ozempic only.  Counseled on low glycemic diet, creating caloric deficit  Orders: -     Semaglutide(0.25 or 0.5MG /DOS); Inject 0.25 mg into the skin once a week.  Dispense: 3 mL; Refill: 2  Elevated glucose -     POCT glycosylated hemoglobin (Hb A1C)     Return precautions given.   Risks, benefits, and alternatives of the medications and treatment plan prescribed today were discussed, and patient expressed understanding.   Education regarding symptom management and diagnosis given to patient on AVS either electronically or printed.  Return in about 3 months (around 09/20/2023).  Wayne Plowman, FNP  Subjective:    Patient ID: Wayne Sanford, male    DOB: 1970-11-26, 53 y.o.   MRN: [id number]  CC: Wayne Sanford is a 53 y.o. male who presents today for follow up.   HPI: Is well today.  No new complaints   compliant metformin 100mg  every day.  Tolerating medication well. He never started Crestor  He is interested in Ozempic for weight loss.   Goal of 185lbs.   Works from home and less exercise.   Plays golf and yardwork.   Dietary indiscretion soda, sweet tea.    Adopted . He has no h/o thyroid cancer, MEN.     Allergies: Patient has no known allergies. Current Outpatient Medications on File Prior to Visit  Medication Sig Dispense Refill   metFORMIN (GLUCOPHAGE-XR) 500 MG 24 hr tablet Take 2 tablets (1,000 mg total) by mouth every evening. 180 tablet 3   fluorouracil (EFUDEX) 5 % cream Apply topically 2 (two) times daily. Bid to temples, cheeks, and scalp for 4 to  7 days, then wait a month and repeat (Patient not taking: Reported on 06/20/2023) 30 g 3   No current facility-administered medications on file prior to visit.    Review of Systems  Constitutional:  Negative for chills and fever.  Respiratory:  Negative for cough.   Cardiovascular:  Negative for chest pain and palpitations.  Gastrointestinal:  Negative for nausea and vomiting.      Objective:    BP 120/78   Pulse 78   Temp 98.2 F (36.8 C) (Oral)   Ht 5\' 11"  (1.803 m)   Wt 213 lb 6.4 oz (96.8 kg)   SpO2 97%   BMI 29.76 kg/m  BP Readings from Last 3 Encounters:  06/20/23 120/78  02/20/23 118/80  01/24/23 117/72   Wt Readings from Last 3 Encounters:  06/20/23 213 lb 6.4 oz (96.8 kg)  02/20/23 214 lb (97.1 kg)  11/13/22 216 lb 6.4 oz (98.2 kg)    Physical Exam Vitals reviewed.  Constitutional:      Appearance: He is well-developed.  Neck:     Thyroid: No thyroid mass, thyromegaly or thyroid tenderness.  Cardiovascular:     Rate and Rhythm: Regular rhythm.     Heart sounds: Normal heart sounds.  Pulmonary:     Effort: Pulmonary effort is normal. No respiratory distress.     Breath sounds: Normal breath sounds. No wheezing, rhonchi or rales.  Skin:    General: Skin  is warm and dry.  Neurological:     Mental Status: He is alert.  Psychiatric:        Speech: Speech normal.        Behavior: Behavior normal.

## 2023-06-20 NOTE — Patient Instructions (Addendum)
 You are overdue for CT chest follow-up with pulmonology to follow lung nodule.  I would strongly encourage you again to call [facility] to schedule follow-up with [place] and CT chest  [phone]) [phone]  Start ozempic 0.25mg  once per week once per week injected subcutaneously ( [place])  in stomach. Please clean with alcohol swab prior to injection and be sure to rotate site. You may schedule a nurse visit if you would like to first injection.   After 4 weeks, and if tolerated and weight loss has not reached 1-2 lbs per week, please increase to 0.5mg  once per week [place].    Please read information on medication below and remember black box warning that you may not take if you or a family member is diagnosed with thyroid cancer (medullary thyroid cancer), or multiple endocrine neoplasia ( MEN).       Semaglutide injection solution What is this medicine? SEMAGLUTIDE (Sem a GLOO tide) is used to improve blood sugar control in adults with type 2 diabetes. This medicine may be used with other diabetes medicines. This drug may also reduce the risk of heart attack or stroke if you have type 2 diabetes and risk factors for heart disease. This medicine may be used for other purposes; ask your health care provider or pharmacist if you have questions. COMMON BRAND NAME(S): OZEMPIC What should I tell my health care provider before I take this medicine? They need to know if you have any of these conditions: endocrine tumors (MEN 2) or if someone in your family had these tumors eye disease, vision problems history of pancreatitis kidney disease stomach problems thyroid cancer or if someone in your family had thyroid cancer an unusual or allergic reaction to semaglutide, other medicines, foods, dyes, or preservatives pregnant or trying to get pregnant breast-feeding How should I use this medicine? This medicine is for injection under the skin of your upper leg (thigh), stomach area, or upper arm. It is  given once every week (every 7 days). You will be taught how to prepare and give this medicine. Use exactly as directed. Take your medicine at regular intervals. Do not take it more often than directed. If you use this medicine with insulin, you should inject this medicine and the insulin separately. Do not mix them together. Do not give the injections right next to each other. Change (rotate) injection sites with each injection. It is important that you put your used needles and syringes in a special sharps container. Do not put them in a trash can. If you do not have a sharps container, call your pharmacist or healthcare provider to get one. A special MedGuide will be given to you by the pharmacist with each prescription and refill. Be sure to read this information carefully each time. This drug comes with INSTRUCTIONS FOR USE. Ask your pharmacist for directions on how to use this drug. Read the information carefully. Talk to your pharmacist or health care provider if you have questions. Talk to your pediatrician regarding the use of this medicine in children. Special care may be needed. Overdosage: If you think you have taken too much of this medicine contact a poison control center or emergency room at once. NOTE: This medicine is only for you. Do not share this medicine with others. What if I miss a dose? If you miss a dose, take it as soon as you can within 5 days after the missed dose. Then take your next dose at your regular weekly time. If  it has been longer than 5 days after the missed dose, do not take the missed dose. Take the next dose at your regular time. Do not take double or extra doses. If you have questions about a missed dose, contact your health care provider for advice. What may interact with this medicine? other medicines for diabetes Many medications may cause changes in blood sugar, these include: alcohol containing beverages antiviral medicines for HIV or AIDS aspirin and  aspirin-like drugs certain medicines for blood pressure, heart disease, irregular heart beat chromium diuretics male hormones, such as estrogens or progestins, birth control pills fenofibrate gemfibrozil isoniazid lanreotide male hormones or anabolic steroids MAOIs like Carbex, Eldepryl, Marplan, Nardil, and Parnate medicines for weight loss medicines for allergies, asthma, cold, or cough medicines for depression, anxiety, or psychotic disturbances niacin nicotine NSAIDs, medicines for pain and inflammation, like ibuprofen or naproxen octreotide pasireotide pentamidine phenytoin probenecid quinolone antibiotics such as ciprofloxacin, levofloxacin, ofloxacin some herbal dietary supplements steroid medicines such as prednisone or cortisone sulfamethoxazole; trimethoprim thyroid hormones Some medications can hide the warning symptoms of low blood sugar (hypoglycemia). You may need to monitor your blood sugar more closely if you are taking one of these medications. These include: beta-blockers, often used for high blood pressure or heart problems (examples include atenolol, metoprolol, propranolol) clonidine guanethidine reserpine This list may not describe all possible interactions. Give your health care provider a list of all the medicines, herbs, non-prescription drugs, or dietary supplements you use. Also tell them if you smoke, drink alcohol, or use illegal drugs. Some items may interact with your medicine. What should I watch for while using this medicine? Visit your doctor or health care professional for regular checks on your progress. Drink plenty of fluids while taking this medicine. Check with your doctor or health care professional if you get an attack of severe diarrhea, nausea, and vomiting. The loss of too much body fluid can make it dangerous for you to take this medicine. A test called the HbA1C (A1C) will be monitored. This is a simple blood test. It measures your  blood sugar control over the last 2 to 3 months. You will receive this test every 3 to 6 months. Learn how to check your blood sugar. Learn the symptoms of low and high blood sugar and how to manage them. Always carry a quick-source of sugar with you in case you have symptoms of low blood sugar. Examples include hard sugar candy or glucose tablets. Make sure others know that you can choke if you eat or drink when you develop serious symptoms of low blood sugar, such as seizures or unconsciousness. They must get medical help at once. Tell your doctor or health care professional if you have high blood sugar. You might need to change the dose of your medicine. If you are sick or exercising more than usual, you might need to change the dose of your medicine. Do not skip meals. Ask your doctor or health care professional if you should avoid alcohol. Many nonprescription cough and cold products contain sugar or alcohol. These can affect blood sugar. Pens should never be shared. Even if the needle is changed, sharing may result in passing of viruses like hepatitis or HIV. Wear a medical ID bracelet or chain, and carry a card that describes your disease and details of your medicine and dosage times. Do not become pregnant while taking this medicine. Women should inform their doctor if they wish to become pregnant or think they might be  pregnant. There is a potential for serious side effects to an unborn child. Talk to your health care professional or pharmacist for more information. What side effects may I notice from receiving this medicine? Side effects that you should report to your doctor or health care professional as soon as possible: allergic reactions like skin rash, itching or hives, swelling of the face, lips, or tongue breathing problems changes in vision diarrhea that continues or is severe lump or swelling on the neck severe nausea signs and symptoms of infection like fever or chills; cough;  sore throat; pain or trouble passing urine signs and symptoms of low blood sugar such as feeling anxious, confusion, dizziness, increased hunger, unusually weak or tired, sweating, shakiness, cold, irritable, headache, blurred vision, fast heartbeat, loss of consciousness signs and symptoms of kidney injury like trouble passing urine or change in the amount of urine trouble swallowing unusual stomach upset or pain vomiting Side effects that usually do not require medical attention (report to your doctor or health care professional if they continue or are bothersome): constipation diarrhea nausea pain, redness, or irritation at site where injected stomach upset This list may not describe all possible side effects. Call your doctor for medical advice about side effects. You may report side effects to FDA at 1-800-FDA-1088. Where should I keep my medicine? Keep out of the reach of children. Store unopened pens in a refrigerator between 2 and 8 degrees C (36 and 46 degrees F). Do not freeze. Protect from light and heat. After you first use the pen, it can be stored for 56 days at room temperature between 15 and 30 degrees C (59 and 86 degrees F) or in a refrigerator. Throw away your used pen after 56 days or after the expiration date, whichever comes first. Do not store your pen with the needle attached. If the needle is left on, medicine may leak from the pen. NOTE: This sheet is a summary. It may not cover all possible information. If you have questions about this medicine, talk to your doctor, pharmacist, or health care provider.  2021 Elsevier/Gold Standard (2018-12-17 09:41:51)   Please download Myfitness Pal App ( basic version is free).   You may log every thing you eat for even 2-3 days to get a better of idea of total daily calories. To loose weight, we have to create caloric deficit to loose weight. The goal is 1-2 lbs per week of weight loss.   Excellent article below from [facility].   [email or web address].it  Calorie counting made easy  Eat less, exercise more. If only it were that simple! As most dieters know, losing weight can be very challenging. As this report details, a range of influences can affect how people gain and lose weight. But a basic understanding of how to tip your energy balance in favor of weight loss is a good place to start.  Start by determining how many calories you should consume each day. To do so, you need to know how many calories you need to maintain your current weight. Doing this requires a few simple calculations.  First, multiply your current weight by 15 -- that's roughly the number of calories per pound of body weight needed to maintain your current weight if you are moderately active. Moderately active means getting at least 30 minutes of physical activity a day in the form of exercise (walking at a brisk pace, climbing stairs, or active gardening). Let's say you're a woman who is 5 feet,  4 inches tall and weighs 155 pounds, and you need to lose about 15 pounds to put you in a healthy weight range. If you multiply 155 by 15, you will get 2,325, which is the number of calories per day that you need in order to maintain your current weight (weight-maintenance calories). To lose weight, you will need to get below that total.  For example, to lose 1 to 2 pounds a week -- a rate that experts consider safe -- your food consumption should provide 500 to 1,000 calories less than your total weight-maintenance calories. If you need 2,325 calories a day to maintain your current weight, reduce your daily calories to between 1,325 and 1,825. If you are sedentary, you will also need to build more activity into your day. In order to lose at least a pound a week, try to do at least 30 minutes of physical activity on most days, and reduce your daily calorie intake by at least 500 calories. However, calorie  intake should not fall below 1,200 a day in women or 1,500 a day in men, except under the supervision of a health professional. Eating too few calories can endanger your health by depriving you of needed nutrients.  Meeting your calorie target How can you meet your daily calorie target? One approach is to add up the number of calories per serving of all the foods that you eat, and then plan your menus accordingly. You can buy books that list calories per serving for many foods. In addition, the nutrition labels on all packaged foods and beverages provide calories per serving information. Make a point of reading the labels of the foods and drinks you use, noting the number of calories and the serving sizes. Many recipes published in cookbooks, newspapers, and magazines provide similar information.  If you hate counting calories, a different approach is to restrict how much and how often you eat, and to eat meals that are low in calories. Dietary guidelines issued by the American Heart Association stress common sense in choosing your foods rather than focusing strictly on numbers, such as total calories or calories from fat. Whichever method you choose, research shows that a regular eating schedule -- with meals and snacks planned for certain times each day -- makes for the most successful approach. The same applies after you have lost weight and want to keep it off. Sticking with an eating schedule increases your chance of maintaining your new weight.    This is  Dr. Melina Schools  ( an amazing physician in my office!)  example of a  "Low GI"  Diet:  It will allow you to lose 4 to 8  lbs  per month if you follow it carefully.  Your goal with exercise is a minimum of 30 minutes of aerobic exercise 5 days per week (Walking does not count once it becomes easy!)    All of the foods can be found at grocery stores and in bulk at [employer].  The Atkins protein bars and shakes are available in more varieties at [employer],  [employer] and [employer].     7 AM Breakfast:  Choose from the following:  Low carbohydrate Protein  Shakes (I recommend the  Premier Protein chocolate shakes,  EAS AdvantEdge "Carb Control" shakes  Or the Atkins shakes all are under 3 net carbs)     a scrambled egg/bacon/cheese burrito made with Mission's "carb balance" whole wheat tortilla  (about 10 net carbs )  [employer]  sells microwaveable frittata (basically a quiche without the pastry crust) that is eaten cold and very convenient way to get your eggs.  8 carbs)  If you make your own protein shakes, avoid bananas and pineapple,  And use low carb greek yogurt or original /unsweetened almond or soy milk    Avoid cereal and bananas, oatmeal and cream of wheat and grits. They are loaded with carbohydrates!   10 AM: high protein snack:  Protein bar by Atkins (the snack size, under 200 cal, usually < 6 net carbs).    A stick of cheese:  Around 1 carb,  100 cal     Dannon Light n Fit [place] Yogurt  (80 cal, 8 carbs)  Other so called "protein bars" and Greek yogurts tend to be loaded with carbohydrates.  Remember, in food advertising, the word "energy" is synonymous for " carbohydrate."  Lunch:   A Sandwich using the bread choices listed, Can use any  Eggs,  lunchmeat, grilled meat or canned tuna), avocado, regular mayo/mustard  and cheese.  A Salad using blue cheese, ranch,  Goddess or vinagrette,  Avoid taco shells, croutons or "confetti" and no "candied nuts" but regular nuts OK.   No pretzels, nabs  or chips.  Pickles and miniature sweet peppers are a good low carb alternative that provide a "crunch"  The bread is the only source of carbohydrate in a sandwich and  can be decreased by trying some of the attached alternatives to traditional loaf bread   Avoid "Low fat dressings, as well as Reyne Dumas and [employer] dressings They are loaded with sugar!   3 PM/ Mid day  Snack:  Consider  1 ounce of  almonds, walnuts, pistachios,  pecans, peanuts,  Macadamia nuts or a nut medley.  Avoid "granola and granola bars "  Mixed nuts are ok in moderation as long as there are no raisins,  cranberries or dried fruit.   KIND bars are OK if you get the low glycemic index variety   Try the prosciutto/mozzarella cheese sticks by [employer]  In deli /backery section   High protein      6 PM  Dinner:     Meat/fowl/fish with a green salad, and either broccoli, cauliflower, green beans, spinach, brussel sprouts or  Lima beans. DO NOT BREAD THE PROTEIN!!      There is a low carb pasta by [employer]field's that is acceptable and tastes great: only 5 digestible carbs/serving.( All grocery stores but [employer]Js carry it ) Several ready made meals are available low carb:   Try Michel Angelo's chicken piccata or chicken or eggplant parm over low carb pasta.(Lowes and BJs)   Clifton Custard Sanchez's "Carnitas" (pulled pork, no sauce,  0 carbs) or his beef pot roast to make a dinner burrito (at [employer])  Pesto over low carb pasta (bj's sells a good quality pesto in the center refrigerated section of the deli   Try satueeing  Roosvelt Harps with mushroooms as a good side   [employer] makes a mashed cauliflower that tastes like mashed potatoes  Whole wheat pasta is still full of digestible carbs and  Not as low in glycemic index as Dreamfield's.   Campisi rice is still rice,  So skip the rice and noodles if you eat [place] or [place] (or at least limit to 1/2 cup)  9 PM snack :   Breyer's "low carb" fudgsicle or  ice cream bar (Carb Smart line), or  Weight Watcher's ice cream bar , or  another "no sugar added" ice cream;  a serving of fresh berries/cherries with whipped cream   Cheese or DANNON'S LlGHT [employer] FIT GREEK YOGURT  8 ounces of [place] unsweetened almond/cococunut milk    Treat yourself to a parfait made with whipped cream blueberiies, walnuts and vanilla greek yogurt  Avoid bananas, pineapple, grapes  and watermelon on a regular basis because they are high in sugar.   THINK OF THEM AS DESSERT  Remember that snack Substitutions should be less than 10 NET carbs per serving and meals < 20 carbs. Remember to subtract fiber grams to get the "net carbs."

## 2023-08-22 ENCOUNTER — Ambulatory Visit: Payer: 59 | Admitting: Dermatology

## 2023-08-22 ENCOUNTER — Encounter: Payer: Self-pay | Admitting: Dermatology

## 2023-08-22 DIAGNOSIS — L820 Inflamed seborrheic keratosis: Secondary | ICD-10-CM

## 2023-08-22 DIAGNOSIS — D492 Neoplasm of unspecified behavior of bone, soft tissue, and skin: Secondary | ICD-10-CM | POA: Diagnosis not present

## 2023-08-22 DIAGNOSIS — Z8589 Personal history of malignant neoplasm of other organs and systems: Secondary | ICD-10-CM

## 2023-08-22 DIAGNOSIS — Z85828 Personal history of other malignant neoplasm of skin: Secondary | ICD-10-CM

## 2023-08-22 DIAGNOSIS — D1801 Hemangioma of skin and subcutaneous tissue: Secondary | ICD-10-CM

## 2023-08-22 DIAGNOSIS — L578 Other skin changes due to chronic exposure to nonionizing radiation: Secondary | ICD-10-CM | POA: Diagnosis not present

## 2023-08-22 DIAGNOSIS — Z7189 Other specified counseling: Secondary | ICD-10-CM

## 2023-08-22 DIAGNOSIS — L910 Hypertrophic scar: Secondary | ICD-10-CM | POA: Diagnosis not present

## 2023-08-22 DIAGNOSIS — Z1283 Encounter for screening for malignant neoplasm of skin: Secondary | ICD-10-CM | POA: Diagnosis not present

## 2023-08-22 DIAGNOSIS — Z79899 Other long term (current) drug therapy: Secondary | ICD-10-CM

## 2023-08-22 DIAGNOSIS — W908XXA Exposure to other nonionizing radiation, initial encounter: Secondary | ICD-10-CM | POA: Diagnosis not present

## 2023-08-22 DIAGNOSIS — D489 Neoplasm of uncertain behavior, unspecified: Secondary | ICD-10-CM

## 2023-08-22 DIAGNOSIS — L570 Actinic keratosis: Secondary | ICD-10-CM | POA: Diagnosis not present

## 2023-08-22 DIAGNOSIS — Z5111 Encounter for antineoplastic chemotherapy: Secondary | ICD-10-CM

## 2023-08-22 DIAGNOSIS — L821 Other seborrheic keratosis: Secondary | ICD-10-CM

## 2023-08-22 DIAGNOSIS — L814 Other melanin hyperpigmentation: Secondary | ICD-10-CM

## 2023-08-22 DIAGNOSIS — D229 Melanocytic nevi, unspecified: Secondary | ICD-10-CM

## 2023-08-22 MED ORDER — TRIAMCINOLONE ACETONIDE 10 MG/ML IJ SUSP
10.0000 mg | Freq: Once | INTRAMUSCULAR | Status: AC
  Administered 2023-08-22: 10 mg via INTRAMUSCULAR

## 2023-08-22 NOTE — Patient Instructions (Addendum)
 Start cream in mid June  - Start 5-fluorouracil /calcipotriene cream twice a day for 10 days to affected areas including scalp. Prescription sent to [facility]. Patient advised they will receive an email to purchase the medication online and have it sent to their home. Patient provided with handout reviewing treatment course and side effects and advised to call or message us  on MyChart with any concerns.  Reviewed course of treatment and expected reaction.  Patient advised to expect inflammation and crusting and advised that erosions are possible.  Patient advised to be diligent with sun protection during and after treatment. Counseled to keep medication out of reach of children and pets.   5-Fluorouracil /Calcipotriene Patient Education   Actinic keratoses are the dry, red scaly spots on the skin caused by sun damage. A portion of these spots can turn into skin cancer with time, and treating them can help prevent development of skin cancer.   Treatment of these spots requires removal of the defective skin cells. There are various ways to remove actinic keratoses, including freezing with liquid nitrogen, treatment with creams, or treatment with a blue light procedure in the office.   5-fluorouracil  cream is a topical cream used to treat actinic keratoses. It works by interfering with the growth of abnormal fast-growing skin cells, such as actinic keratoses. These cells peel off and are replaced by healthy ones.   5-fluorouracil /calcipotriene is a combination of the 5-fluorouracil  cream with a vitamin D  analog cream called calcipotriene. The calcipotriene alone does not treat actinic keratoses. However, when it is combined with 5-fluorouracil , it helps the 5-fluorouracil  treat the actinic keratoses much faster so that the same results can be achieved with a much shorter treatment time.  INSTRUCTIONS FOR 5-FLUOROURACIL /CALCIPOTRIENE CREAM:   5-fluorouracil /calcipotriene  cream typically only needs to be used for 4-7 days. A thin layer should be applied twice a day to the treatment areas recommended by your physician.   If your physician prescribed you separate tubes of 5-fluourouracil and calcipotriene, apply a thin layer of 5-fluorouracil  followed by a thin layer of calcipotriene.   Avoid contact with your eyes, nostrils, and mouth. Do not use 5-fluorouracil /calcipotriene cream on infected or open wounds.   You will develop redness, irritation and some crusting at areas where you have pre-cancer damage/actinic keratoses. IF YOU DEVELOP PAIN, BLEEDING, OR SIGNIFICANT CRUSTING, STOP THE TREATMENT EARLY - you have already gotten a good response and the actinic keratoses should clear up well.  Wash your hands after applying 5-fluorouracil  5% cream on your skin.   A moisturizer or sunscreen with a minimum SPF 30 should be applied each morning.   Once you have finished the treatment, you can apply a thin layer of Vaseline twice a day to irritated areas to soothe and calm the areas more quickly. If you experience significant discomfort, contact your physician.  For some patients it is necessary to repeat the treatment for best results.  SIDE EFFECTS: When using 5-fluorouracil /calcipotriene cream, you may have mild irritation, such as redness, dryness, swelling, or a mild burning sensation. This usually resolves within 2 weeks. The more actinic keratoses you have, the more redness and inflammation you can expect during treatment. Eye irritation has been reported rarely. If this occurs, please let us  know.  If you have any trouble using this cream, please call the office. If you have any other questions about this information, please do not hesitate to ask me before you leave the office.     Electrodesiccation and Curettage ("  Scrape and Burn") Wound Care Instructions  Leave the original bandage on for 24 hours if possible.  If the bandage becomes soaked or soiled  before that time, it is OK to remove it and examine the wound.  A small amount of post-operative bleeding is normal.  If excessive bleeding occurs, remove the bandage, place gauze over the site and apply continuous pressure (no peeking) over the area for 30 minutes. If this does not work, please call our clinic as soon as possible or page your doctor if it is after hours.   Once a day, cleanse the wound with soap and water. It is fine to shower. If a thick crust develops you may use a Q-tip dipped into dilute hydrogen peroxide (mix 1:1 with water) to dissolve it.  Hydrogen peroxide can slow the healing process, so use it only as needed.    After washing, apply petroleum jelly (Vaseline) or an antibiotic ointment if your doctor prescribed one for you, followed by a bandage.    For best healing, the wound should be covered with a layer of ointment at all times. If you are not able to keep the area covered with a bandage to hold the ointment in place, this may mean re-applying the ointment several times a day.  Continue this wound care until the wound has healed and is no longer open. It may take several weeks for the wound to heal and close.  Itching and mild discomfort is normal during the healing process.  If you have any discomfort, you can take Tylenol  (acetaminophen ) or ibuprofen as directed on the bottle. (Please do not take these if you have an allergy to them or cannot take them for another reason).  Some redness, tenderness and white or yellow material in the wound is normal healing.  If the area becomes very sore and red, or develops a thick yellow-green material (pus), it may be infected; please notify us .    Wound healing continues for up to one year following surgery. It is not unusual to experience pain in the scar from time to time during the interval.  If the pain becomes severe or the scar thickens, you should notify the office.    A slight amount of redness in a scar is expected for  the first six months.  After six months, the redness will fade and the scar will soften and fade.  The color difference becomes less noticeable with time.  If there are any problems, return for a post-op surgery check at your earliest convenience.  To improve the appearance of the scar, you can use silicone scar gel, cream, or sheets (such as Mederma or Serica) every night for up to one year. These are available over the counter (without a prescription).  Please call our office at [phone] for any questions or concerns.  Biopsy Wound Care Instructions  Leave the original bandage on for 24 hours if possible.  If the bandage becomes soaked or soiled before that time, it is OK to remove it and examine the wound.  A small amount of post-operative bleeding is normal.  If excessive bleeding occurs, remove the bandage, place gauze over the site and apply continuous pressure (no peeking) over the area for 30 minutes. If this does not work, please call our clinic as soon as possible or page your doctor if it is after hours.   Once a day, cleanse the wound with soap and water. It is fine to shower. If a thick  crust develops you may use a Q-tip dipped into dilute hydrogen peroxide (mix 1:1 with water) to dissolve it.  Hydrogen peroxide can slow the healing process, so use it only as needed.    After washing, apply petroleum jelly (Vaseline) or an antibiotic ointment if your doctor prescribed one for you, followed by a bandage.    For best healing, the wound should be covered with a layer of ointment at all times. If you are not able to keep the area covered with a bandage to hold the ointment in place, this may mean re-applying the ointment several times a day.  Continue this wound care until the wound has healed and is no longer open.   Itching and mild discomfort is normal during the healing process. However, if you develop pain or severe itching, please call our office.   If you have any discomfort,  you can take Tylenol  (acetaminophen ) or ibuprofen as directed on the bottle. (Please do not take these if you have an allergy to them or cannot take them for another reason).  Some redness, tenderness and white or yellow material in the wound is normal healing.  If the area becomes very sore and red, or develops a thick yellow-green material (pus), it may be infected; please notify us .    If you have stitches, return to clinic as directed to have the stitches removed. You will continue wound care for 2-3 days after the stitches are removed.   Wound healing continues for up to one year following surgery. It is not unusual to experience pain in the scar from time to time during the interval.  If the pain becomes severe or the scar thickens, you should notify the office.    A slight amount of redness in a scar is expected for the first six months.  After six months, the redness will fade and the scar will soften and fade.  The color difference becomes less noticeable with time.  If there are any problems, return for a post-op surgery check at your earliest convenience.  To improve the appearance of the scar, you can use silicone scar gel, cream, or sheets (such as Mederma or Serica) every night for up to one year. These are available over the counter (without a prescription).  Please call our office at [phone] for any questions or concerns.     Actinic keratoses are precancerous spots that appear secondary to cumulative UV radiation exposure/sun exposure over time. They are chronic with expected duration over 1 year. A portion of actinic keratoses will progress to squamous cell carcinoma of the skin. It is not possible to reliably predict which spots will progress to skin cancer and so treatment is recommended to prevent development of skin cancer.  Recommend daily broad spectrum sunscreen SPF 30+ to sun-exposed areas, reapply every 2 hours as needed.  Recommend staying in the shade or wearing  long sleeves, sun glasses (UVA+UVB protection) and wide brim hats (4-inch brim around the entire circumference of the hat). Call for new or changing lesions.    Cryotherapy Aftercare  Wash gently with soap and water everyday.   Apply Vaseline and Band-Aid daily until healed.     Seborrheic Keratosis  What causes seborrheic keratoses? Seborrheic keratoses are harmless, common skin growths that first appear during adult life.  As time goes by, more growths appear.  Some people may develop a large number of them.  Seborrheic keratoses appear on both covered and uncovered body parts.  They are  not caused by sunlight.  The tendency to develop seborrheic keratoses can be inherited.  They vary in color from skin-colored to gray, [place], or even black.  They can be either smooth or have a rough, warty surface.   Seborrheic keratoses are superficial and look as if they were stuck on the skin.  Under the microscope this type of keratosis looks like layers upon layers of skin.  That is why at times the top layer may seem to fall off, but the rest of the growth remains and re-grows.    Treatment Seborrheic keratoses do not need to be treated, but can easily be removed in the office.  Seborrheic keratoses often cause symptoms when they rub on clothing or jewelry.  Lesions can be in the way of shaving.  If they become inflamed, they can cause itching, soreness, or burning.  Removal of a seborrheic keratosis can be accomplished by freezing, burning, or surgery. If any spot bleeds, scabs, or grows rapidly, please return to have it checked, as these can be an indication of a skin cancer.      Melanoma ABCDEs  Melanoma is the most dangerous type of skin cancer, and is the leading cause of death from skin disease.  You are more likely to develop melanoma if you: Have light-colored skin, light-colored eyes, or red or blond hair Spend a lot of time in the sun Tan regularly, either outdoors or in a tanning  bed Have had blistering sunburns, especially during childhood Have a close family member who has had a melanoma Have atypical moles or large birthmarks  Early detection of melanoma is key since treatment is typically straightforward and cure rates are extremely high if we catch it early.   The first sign of melanoma is often a change in a mole or a new dark spot.  The ABCDE system is a way of remembering the signs of melanoma.  A for asymmetry:  The two halves do not match. B for border:  The edges of the growth are irregular. C for color:  A mixture of colors are present instead of an even [place] color. D for diameter:  Melanomas are usually (but not always) greater than 6mm - the size of a pencil eraser. E for evolution:  The spot keeps changing in size, shape, and color.  Please check your skin once per month between visits. You can use a small mirror in front and a large mirror behind you to keep an eye on the back side or your body.   If you see any new or changing lesions before your next follow-up, please call to schedule a visit.  Please continue daily skin protection including broad spectrum sunscreen SPF 30+ to sun-exposed areas, reapplying every 2 hours as needed when you're outdoors.   Staying in the shade or wearing long sleeves, sun glasses (UVA+UVB protection) and wide brim hats (4-inch brim around the entire circumference of the hat) are also recommended for sun protection.    Due to recent changes in healthcare laws, you may see results of your pathology and/or laboratory studies on MyChart before the doctors have had a chance to review them. We understand that in some cases there may be results that are confusing or concerning to you. Please understand that not all results are received at the same time and often the doctors may need to interpret multiple results in order to provide you with the best plan of care or course of treatment. Therefore, we ask  that you please give us   2 business days to thoroughly review all your results before contacting the office for clarification. Should we see a critical lab result, you will be contacted sooner.   If You Need Anything After Your Visit  If you have any questions or concerns for your doctor, please call our main line at [phone] and press option 4 to reach your doctor's medical assistant. If no one answers, please leave a voicemail as directed and we will return your call as soon as possible. Messages left after 4 pm will be answered the following business day.   You may also send us  a message via MyChart. We typically respond to MyChart messages within 1-2 business days.  For prescription refills, please ask your pharmacy to contact our office. Our fax number is [phone].  If you have an urgent issue when the clinic is closed that cannot wait until the next business day, you can page your doctor at the number below.    Please note that while we do our best to be available for urgent issues outside of office hours, we are not available 24/7.   If you have an urgent issue and are unable to reach us , you may choose to seek medical care at your doctor's office, retail clinic, urgent care center, or emergency room.  If you have a medical emergency, please immediately call 911 or go to the emergency department.  Pager Numbers  - Dr. Bary Likes: [phone]  - Dr. Annette Barters: [phone]  - Dr. Felipe Horton: [phone]   In the event of inclement weather, please call our main line at [phone] for an update on the status of any delays or closures.  Dermatology Medication Tips: Please keep the boxes that topical medications come in in order to help keep track of the instructions about where and how to use these. Pharmacies typically print the medication instructions only on the boxes and not directly on the medication tubes.   If your medication is too expensive, please contact our office at [phone] option 4 or  send us  a message through MyChart.   We are unable to tell what your co-pay for medications will be in advance as this is different depending on your insurance coverage. However, we may be able to find a substitute medication at lower cost or fill out paperwork to get insurance to cover a needed medication.   If a prior authorization is required to get your medication covered by your insurance company, please allow us  1-2 business days to complete this process.  Drug prices often vary depending on where the prescription is filled and some pharmacies may offer cheaper prices.  The website www.goodrx.com contains coupons for medications through different pharmacies. The prices here do not account for what the cost may be with help from insurance (it may be cheaper with your insurance), but the website can give you the price if you did not use any insurance.  - You can print the associated coupon and take it with your prescription to the pharmacy.  - You may also stop by our office during regular business hours and pick up a GoodRx coupon card.  - If you need your prescription sent electronically to a different pharmacy, notify our office through [facility] or by phone at [phone] option 4.     Si Usted Necesita Algo Despus de Su Visita  Tambin puede enviarnos un mensaje a travs de Clinical cytogeneticist. Por lo general respondemos a los Multimedia programmer en  el transcurso de 1 a 2 das hbiles.  Para renovar recetas, por favor pida a su farmacia que se ponga en contacto con nuestra oficina. Franz Jacks de fax es [place] [phone].  Si tiene un asunto urgente cuando la clnica est cerrada y que no puede esperar hasta el siguiente da hbil, puede llamar/localizar a su doctor(a) al nmero que aparece a continuacin.   Por favor, tenga en cuenta que aunque hacemos todo lo posible para estar disponibles para asuntos urgentes fuera del horario de [place], no estamos disponibles las 24 horas del  da, los 7 [address]  [address] de la [place].   Si tiene un problema urgente y no puede comunicarse con nosotros, puede optar por buscar atencin mdica  en el consultorio de su doctor(a), en una clnica privada, en un centro de atencin urgente o en una sala de emergencias.  Si tiene Engineer, drilling, por favor llame inmediatamente al 911 o vaya a la sala de emergencias.  Nmeros de bper  - Dr. Bary Likes: [phone]  - Dra. Annette Barters: [phone]  - Dr. Felipe Horton: [phone]   En caso de inclemencias del tiempo, por favor llame a Lajuan Pila principal al [phone] para una actualizacin sobre el [place] de cualquier retraso o cierre.  Consejos para la medicacin en dermatologa: Por favor, guarde las cajas en las que vienen los medicamentos de uso tpico para ayudarle a seguir las instrucciones sobre dnde y cmo usarlos. Las farmacias generalmente imprimen las instrucciones del medicamento slo en las cajas y no directamente en los tubos del [place].   Si su medicamento es muy caro, por favor, pngase en contacto con Bettyjane Brunet llamando al [phone] y presione la opcin 4 o envenos un mensaje a travs de Clinical cytogeneticist.   No podemos decirle cul ser su copago por los medicamentos por adelantado ya que esto es diferente dependiendo de la cobertura de su seguro. Sin embargo, es posible que podamos encontrar un medicamento sustituto a Audiological scientist un formulario para que el seguro cubra el medicamento que se considera necesario.   Si se requiere una autorizacin previa para que su compaa de seguros [place] su medicamento, por favor permtanos de 1 a 2 das hbiles para completar este proceso.  Los precios de los medicamentos varan con frecuencia dependiendo del Environmental consultant de dnde se surte la receta y alguna farmacias pueden ofrecer precios ms baratos.  El sitio web www.goodrx.com tiene cupones para medicamentos de Health and safety inspector. Los precios aqu no tienen en cuenta lo que podra  costar con la ayuda del seguro (puede ser ms barato con su seguro), pero el sitio web puede darle el precio si no utiliz Tourist information centre manager.  - Puede imprimir el cupn correspondiente y llevarlo con su receta a la farmacia.  - Tambin puede pasar por nuestra oficina durante el horario de atencin regular y Education officer, museum una tarjeta de cupones de GoodRx.  - Si necesita que su receta se enve electrnicamente a una farmacia diferente, informe a nuestra oficina a travs de MyChart de [place] o por telfono llamando al [phone] y presione la opcin 4.

## 2023-08-22 NOTE — Progress Notes (Signed)
 Follow-Up Visit   Subjective  Wayne Sanford is a 53 y.o. male who presents for the following: Skin Cancer Screening and Upper Body Skin Exam Hx of aks at scalp and hands. Patient given 68f/u field treatment chemo cream to use at scalp, forehead and temples but reports did not start cream.  The patient presents for Upper Body Skin Exam (UBSE) for skin cancer screening and mole check. The patient has spots, moles and lesions to be evaluated, some may be new or changing and the patient may have concern these could be cancer.  The following portions of the chart were reviewed this encounter and updated as appropriate: medications, allergies, medical history  Review of Systems:  No other skin or systemic complaints except as noted in HPI or Assessment and Plan. Right cheek zygoma - hypertrophic scar  Right cheek zygome - hypertrophic scar  Left upper back hypertrophic scar   Left upper back hypertrophic scar   Objective  Well appearing patient in no apparent distress; mood and affect are within normal limits.  All skin waist up examined. Relevant physical exam findings are noted in the Assessment and Plan.  scalp, face, ears x 21 (21) Erythematous thin papules/macules with gritty scale.  back x 5 (5) Erythematous stuck-on, waxy papule or plaque right clavicle 0.7 cm red patch   right superior lateral pectoral 1.2 cm red patch    Assessment & Plan   HYPERTROPHIC SCAR   Related Medications triamcinolone  acetonide (KENALOG ) 10 MG/ML injection 10 mg  ACTINIC KERATOSIS (21) scalp, face, ears x 21 (21) Recommend starting cream in mid June  - Start 5-fluorouracil /calcipotriene cream twice a day for 10 days to affected areas including scalp. Prescription sent to [facility]. Patient advised they will receive an email to purchase the medication online and have it sent to their home. Patient provided with handout reviewing treatment course and side effects  and advised to call or message us  on MyChart with any concerns.  Reviewed course of treatment and expected reaction.  Patient advised to expect inflammation and crusting and advised that erosions are possible.  Patient advised to be diligent with sun protection during and after treatment. Counseled to keep medication out of reach of children and pets.  ACTINIC DAMAGE WITH PRECANCEROUS ACTINIC KERATOSES Counseling for Topical Chemotherapy Management: Patient exhibits: - Severe, confluent actinic changes with pre-cancerous actinic keratoses that is secondary to cumulative UV radiation exposure over time - Condition that is severe; chronic, not at goal. - diffuse scaly erythematous macules and papules with underlying dyspigmentation - Discussed Prescription "Field Treatment" topical Chemotherapy for Severe, Chronic Confluent Actinic Changes with Pre-Cancerous Actinic Keratoses Field treatment involves treatment of an entire area of skin that has confluent Actinic Changes (Sun/ Ultraviolet light damage) and PreCancerous Actinic Keratoses by method of PhotoDynamic Therapy (PDT) and/or prescription Topical Chemotherapy agents such as 5-fluorouracil , 5-fluorouracil /calcipotriene, and/or imiquimod.  The purpose is to decrease the number of clinically evident and subclinical PreCancerous lesions to prevent progression to development of skin cancer by chemically destroying early precancer changes that may or may not be visible.  It has been shown to reduce the risk of developing skin cancer in the treated area. As a result of treatment, redness, scaling, crusting, and open sores may occur during treatment course. One or more than one of these methods may be used and may have to be used several times to control, suppress and eliminate the PreCancerous changes. Discussed treatment course, expected reaction, and possible side  effects. - Recommend daily broad spectrum sunscreen SPF 30+ to sun-exposed areas, reapply  every 2 hours as needed.  - Staying in the shade or wearing long sleeves, sun glasses (UVA+UVB protection) and wide brim hats (4-inch brim around the entire circumference of the hat) are also recommended. - Call for new or changing lesions.  Actinic keratoses are precancerous spots that appear secondary to cumulative UV radiation exposure/sun exposure over time. They are chronic with expected duration over 1 year. A portion of actinic keratoses will progress to squamous cell carcinoma of the skin. It is not possible to reliably predict which spots will progress to skin cancer and so treatment is recommended to prevent development of skin cancer.  Recommend daily broad spectrum sunscreen SPF 30+ to sun-exposed areas, reapply every 2 hours as needed.  Recommend staying in the shade or wearing long sleeves, sun glasses (UVA+UVB protection) and wide brim hats (4-inch brim around the entire circumference of the hat). Call for new or changing lesions. Destruction of lesion - scalp, face, ears x 21 (21) Complexity: simple   Destruction method: cryotherapy   Informed consent: discussed and consent obtained   Timeout:  patient name, date of birth, surgical site, and procedure verified Lesion destroyed using liquid nitrogen: Yes   Region frozen until ice ball extended beyond lesion: Yes   Outcome: patient tolerated procedure well with no complications   Post-procedure details: wound care instructions given   INFLAMED SEBORRHEIC KERATOSIS (5) back x 5 (5) Symptomatic, irritating, patient would like treated. Destruction of lesion - back x 5 (5) Complexity: simple   Destruction method: cryotherapy   Informed consent: discussed and consent obtained   Timeout:  patient name, date of birth, surgical site, and procedure verified Lesion destroyed using liquid nitrogen: Yes   Region frozen until ice ball extended beyond lesion: Yes   Outcome: patient tolerated procedure well with no complications    Post-procedure details: wound care instructions given   NEOPLASM OF UNCERTAIN BEHAVIOR (2) right clavicle Epidermal / dermal shaving  Lesion diameter (cm):  0.7 Informed consent: discussed and consent obtained   Timeout: patient name, date of birth, surgical site, and procedure verified   Procedure prep:  Patient was prepped and draped in usual sterile fashion Prep type:  Isopropyl alcohol Anesthesia: the lesion was anesthetized in a standard fashion   Anesthetic:  1% lidocaine  w/ epinephrine 1-100,000 buffered w/ 8.4% NaHCO3 Instrument used: flexible razor blade   Hemostasis achieved with: pressure, aluminum chloride and electrodesiccation   Outcome: patient tolerated procedure well   Post-procedure details: sterile dressing applied and wound care instructions given   Dressing type: bandage and petrolatum    Destruction of lesion Complexity: extensive   Destruction method: electrodesiccation and curettage   Informed consent: discussed and consent obtained   Timeout:  patient name, date of birth, surgical site, and procedure verified Procedure prep:  Patient was prepped and draped in usual sterile fashion Prep type:  Isopropyl alcohol Anesthesia: the lesion was anesthetized in a standard fashion   Anesthetic:  1% lidocaine  w/ epinephrine 1-100,000 buffered w/ 8.4% NaHCO3 Curettage performed in three different directions: Yes   Electrodesiccation performed over the curetted area: Yes   Lesion length (cm):  0.7 Lesion width (cm):  0.7 Margin per side (cm):  0.2 Final wound size (cm):  1.1 Hemostasis achieved with:  pressure, aluminum chloride and electrodesiccation Outcome: patient tolerated procedure well with no complications   Post-procedure details: sterile dressing applied and wound care instructions given  Dressing type: bandage and petrolatum   Specimen 1 - Surgical pathology Differential Diagnosis: r/o bcc vs scc  ED&C done Check Margins: No right superior lateral  pectoral Epidermal / dermal shaving  Lesion diameter (cm):  1.2 Informed consent: discussed and consent obtained   Timeout: patient name, date of birth, surgical site, and procedure verified   Procedure prep:  Patient was prepped and draped in usual sterile fashion Prep type:  Isopropyl alcohol Anesthesia: the lesion was anesthetized in a standard fashion   Anesthetic:  1% lidocaine  w/ epinephrine 1-100,000 buffered w/ 8.4% NaHCO3 Instrument used: flexible razor blade   Hemostasis achieved with: pressure, aluminum chloride and electrodesiccation   Outcome: patient tolerated procedure well   Post-procedure details: sterile dressing applied and wound care instructions given   Dressing type: bandage and petrolatum    Destruction of lesion Complexity: extensive   Destruction method: electrodesiccation and curettage   Informed consent: discussed and consent obtained   Timeout:  patient name, date of birth, surgical site, and procedure verified Procedure prep:  Patient was prepped and draped in usual sterile fashion Prep type:  Isopropyl alcohol Anesthesia: the lesion was anesthetized in a standard fashion   Anesthetic:  1% lidocaine  w/ epinephrine 1-100,000 buffered w/ 8.4% NaHCO3 Curettage performed in three different directions: Yes   Electrodesiccation performed over the curetted area: Yes   Lesion length (cm):  1.2 Lesion width (cm):  1.2 Margin per side (cm):  0.2 Final wound size (cm):  1.6 Hemostasis achieved with:  pressure, aluminum chloride and electrodesiccation Outcome: patient tolerated procedure well with no complications   Post-procedure details: sterile dressing applied and wound care instructions given   Dressing type: bandage and petrolatum   Specimen 2 - Surgical pathology Differential Diagnosis: r/o bcc vs scc  ED&C done  Check Margins: No R/o bcc vs scc  SKIN CANCER SCREENING   ACTINIC SKIN DAMAGE   HISTORY OF BASAL CELL CARCINOMA   HISTORY OF SQUAMOUS  CELL CARCINOMA   LENTIGO   MELANOCYTIC NEVUS, UNSPECIFIED LOCATION   CHEMOTHERAPY MANAGEMENT, ENCOUNTER FOR   COUNSELING AND COORDINATION OF CARE   MEDICATION MANAGEMENT   SEBORRHEIC KERATOSIS   Skin cancer screening performed today.  Actinic Damage - Chronic condition, secondary to cumulative UV/sun exposure - diffuse scaly erythematous macules with underlying dyspigmentation - Recommend daily broad spectrum sunscreen SPF 30+ to sun-exposed areas, reapply every 2 hours as needed.  - Staying in the shade or wearing long sleeves, sun glasses (UVA+UVB protection) and wide brim hats (4-inch brim around the entire circumference of the hat) are also recommended for sun protection.  - Call for new or changing lesions.  Lentigines, Seborrheic Keratoses, Hemangiomas - Benign normal skin lesions - Benign-appearing - Call for any changes  Melanocytic Nevi - Tan-Antonini and/or pink-flesh-colored symmetric macules and papules - Benign appearing on exam today - Observation - Call clinic for new or changing moles - Recommend daily use of broad spectrum spf 30+ sunscreen to sun-exposed areas.   HYPERTROPHIC SCAR With symptoms of itch Exam: Dyspigmented papule or plaque At left upper back and right cheek zygoma see photos   Symptomatic, irritating, patient would like treated.  Benign-appearing.  Call clinic for new or changing lesions.   Treatment Plan:  Treat with intralesional triamcinolone .   Procedure Note Intralesional Injection Informed Consent: Discussed risks (infection, pain, bleeding, bruising, thinning of the skin, loss of skin pigment, lack of resolution, and recurrence of lesion) and benefits of the procedure, as well as the alternatives.  Informed consent was obtained. Preparation: The area was prepared a standard fashion. Anesthesia: N/A Procedure Details: An intralesional injection was performed with Kenalog  10 mg/cc.  0.1 cc in left upper back and Kenalog  10  mg/cc 1.0 cc was injected into right cheek zygoma.   NDC #: 6962-9528-41 Exp: April 2024 Location: left upper back and right cheek zygoma Total number of injections: 1 injection in left upper back and 2 injections in right cheek  The patient was instructed on post-op care. Recommend OTC analgesia as needed for pain.  HISTORY OF BASAL CELL CARCINOMA OF THE SKIN 09/19/2012 mid chest  03/21/13 right base of neck 04/21/2019 right lower chest, [facility] 04/21/2019 04/21/2019 right lower flank, [facility] 04/21/2019 08/24/2021 right lower flank ED&C 08/24/2021 right posterior lower neck ED&C 08/24/2021 spinal mid back medial within scar, recurrent ED&C 08/24/2021 spinal mid back lateral within scar, recurrent ED&C 08/24/2021 left top of shoulder ED&C - No evidence of recurrence today - Recommend regular full body skin exams - Recommend daily broad spectrum sunscreen SPF 30+ to sun-exposed areas, reapply every 2 hours as needed.  - Call if any new or changing lesions are noted between office visits  HISTORY OF SQUAMOUS CELL CARCINOMA IN SITU OF THE SKIN 03/21/2013 left clavicle  - No evidence of recurrence today - No lymphadenopathy - Recommend regular full body skin exams - Recommend daily broad spectrum sunscreen SPF 30+ to sun-exposed areas, reapply every 2 hours as needed.  - Call if any new or changing lesions are noted between office visits  HISTORY OF SQUAMOUS CELL CARCINOMA OF THE SKIN 01/10/2022 right lateral cheek - excised 02/06/22  - No evidence of recurrence today - No lymphadenopathy - Recommend regular full body skin exams - Recommend daily broad spectrum sunscreen SPF 30+ to sun-exposed areas, reapply every 2 hours as needed.  - Call if any new or changing lesions are noted between office visits  Return for 6 month tbse .  IRandee Busing, CMA, am acting as scribe for Celine Collard, MD.   Documentation: I have reviewed the above documentation for accuracy and completeness, and I agree with  the above.  Celine Collard, MD

## 2023-08-27 ENCOUNTER — Telehealth: Payer: Self-pay

## 2023-08-27 ENCOUNTER — Encounter: Payer: Self-pay | Admitting: Dermatology

## 2023-08-27 LAB — SURGICAL PATHOLOGY

## 2023-08-27 NOTE — Telephone Encounter (Signed)
-----   Message from Celine Collard sent at 08/27/2023  5:31 PM EDT ----- FINAL DIAGNOSIS        1. Skin, right clavicle :       ACTINIC KERATOSIS, INFLAMED        2. Skin, right superior lateral pectoral :       MACULAR RETICULATED PIGMENTED SEBORRHEIC KERATOSIS   1- PreCancer  Already treated Recheck next visit 2- Benign keratosis Already treated Recheck next visit

## 2023-08-27 NOTE — Telephone Encounter (Signed)
 Left voicemail to return my call

## 2023-08-28 ENCOUNTER — Ambulatory Visit: Payer: Self-pay

## 2023-08-28 NOTE — Telephone Encounter (Addendum)
 Tried calling patient. No answer. LM for patient to return call.  ----- Message from Celine Collard sent at 08/27/2023  5:31 PM EDT ----- FINAL DIAGNOSIS        1. Skin, right clavicle :       ACTINIC KERATOSIS, INFLAMED        2. Skin, right superior lateral pectoral :       MACULAR RETICULATED PIGMENTED SEBORRHEIC KERATOSIS   1- PreCancer  Already treated Recheck next visit 2- Benign keratosis Already treated Recheck next visit

## 2023-08-29 NOTE — Telephone Encounter (Addendum)
 Tried calling patient regarding results. No answer. LM for patient to return call.   ----- Message from Celine Collard sent at 08/27/2023  5:31 PM EDT ----- FINAL DIAGNOSIS        1. Skin, right clavicle :       ACTINIC KERATOSIS, INFLAMED        2. Skin, right superior lateral pectoral :       MACULAR RETICULATED PIGMENTED SEBORRHEIC KERATOSIS   1- PreCancer  Already treated Recheck next visit 2- Benign keratosis Already treated Recheck next visit

## 2023-09-13 NOTE — Telephone Encounter (Signed)
 Left message on voicemail for patient to return my call. Patient has reviewed results on MyChart. Will mark pathology done since not cancerous.

## 2023-09-13 NOTE — Telephone Encounter (Signed)
 Patient informed of pathology results

## 2023-09-13 NOTE — Telephone Encounter (Signed)
-----   Message from Celine Collard sent at 08/27/2023  5:31 PM EDT ----- FINAL DIAGNOSIS        1. Skin, right clavicle :       ACTINIC KERATOSIS, INFLAMED        2. Skin, right superior lateral pectoral :       MACULAR RETICULATED PIGMENTED SEBORRHEIC KERATOSIS   1- PreCancer  Already treated Recheck next visit 2- Benign keratosis Already treated Recheck next visit

## 2023-09-14 ENCOUNTER — Other Ambulatory Visit: Payer: Self-pay | Admitting: Family

## 2023-09-14 DIAGNOSIS — E119 Type 2 diabetes mellitus without complications: Secondary | ICD-10-CM

## 2023-09-25 ENCOUNTER — Ambulatory Visit: Admitting: Family

## 2023-10-04 ENCOUNTER — Other Ambulatory Visit: Payer: Self-pay | Admitting: Family

## 2023-10-04 ENCOUNTER — Ambulatory Visit: Admitting: Family

## 2023-10-04 VITALS — BP 118/76 | HR 73 | Temp 97.8°F | Ht 71.0 in | Wt 204.2 lb

## 2023-10-04 DIAGNOSIS — Z7985 Long-term (current) use of injectable non-insulin antidiabetic drugs: Secondary | ICD-10-CM | POA: Diagnosis not present

## 2023-10-04 DIAGNOSIS — E119 Type 2 diabetes mellitus without complications: Secondary | ICD-10-CM

## 2023-10-04 DIAGNOSIS — R911 Solitary pulmonary nodule: Secondary | ICD-10-CM | POA: Diagnosis not present

## 2023-10-04 DIAGNOSIS — R413 Other amnesia: Secondary | ICD-10-CM | POA: Insufficient documentation

## 2023-10-04 LAB — LIPID PANEL
Cholesterol: 138 mg/dL (ref 0–200)
HDL: 46.8 mg/dL (ref >39.00)
LDL Cholesterol: 70 mg/dL (ref 0–99)
NonHDL: 90.77
Total CHOL/HDL Ratio: 3
Triglycerides: 102 mg/dL (ref 0.0–149.0)
VLDL: 20.4 mg/dL (ref 0.0–40.0)

## 2023-10-04 LAB — COMPREHENSIVE METABOLIC PANEL WITH GFR
ALT: 33 U/L (ref 0–53)
AST: 25 U/L (ref 0–37)
Albumin: 4.5 g/dL (ref 3.5–5.2)
Alkaline Phosphatase: 74 U/L (ref 39–117)
BUN: 16 mg/dL (ref 6–23)
CO2: 31 meq/L (ref 19–32)
Calcium: 10 mg/dL (ref 8.4–10.5)
Chloride: 102 meq/L (ref 96–112)
Creatinine, Ser: 1.07 mg/dL (ref 0.40–1.50)
GFR: 79.2 mL/min (ref >60.00)
Glucose, Bld: 117 mg/dL — ABNORMAL HIGH (ref 70–99)
Potassium: 4.6 meq/L (ref 3.5–5.1)
Sodium: 139 meq/L (ref 135–145)
Total Bilirubin: 1.4 mg/dL — ABNORMAL HIGH (ref 0.2–1.2)
Total Protein: 7.3 g/dL (ref 6.0–8.3)

## 2023-10-04 LAB — CBC WITH DIFFERENTIAL/PLATELET
Basophils Absolute: 0.1 10*3/uL (ref 0.0–0.1)
Basophils Relative: 1 % (ref 0.0–3.0)
Eosinophils Absolute: 0 10*3/uL (ref 0.0–0.7)
Eosinophils Relative: 0.3 % (ref 0.0–5.0)
HCT: 46.7 % (ref 39.0–52.0)
Hemoglobin: 15.9 g/dL (ref 13.0–17.0)
Lymphocytes Relative: 24.9 % (ref 12.0–46.0)
Lymphs Abs: 2.2 10*3/uL (ref 0.7–4.0)
MCHC: 34 g/dL (ref 30.0–36.0)
MCV: 84.9 fl (ref 78.0–100.0)
Monocytes Absolute: 0.6 10*3/uL (ref 0.1–1.0)
Monocytes Relative: 6.9 % (ref 3.0–12.0)
Neutro Abs: 5.8 10*3/uL (ref 1.4–7.7)
Neutrophils Relative %: 66.9 % (ref 43.0–77.0)
Platelets: 242 10*3/uL (ref 150.0–400.0)
RBC: 5.5 Mil/uL (ref 4.22–5.81)
RDW: 13.6 % (ref 11.5–15.5)
WBC: 8.7 10*3/uL (ref 4.0–10.5)

## 2023-10-04 LAB — TSH: TSH: 0.87 u[IU]/mL (ref 0.35–5.50)

## 2023-10-04 LAB — B12 AND FOLATE PANEL
Folate: 11.2 ng/mL (ref >5.9)
Vitamin B-12: 307 pg/mL (ref 211–911)

## 2023-10-04 LAB — HEMOGLOBIN A1C: Hgb A1c MFr Bld: 6.2 % (ref 4.6–6.5)

## 2023-10-04 LAB — PSA: PSA: 1.05 ng/mL (ref 0.10–4.00)

## 2023-10-04 MED ORDER — SEMAGLUTIDE (1 MG/DOSE) 4 MG/3ML ~~LOC~~ SOPN: 1.0000 mg | PEN_INJECTOR | SUBCUTANEOUS | 3 refills | Status: DC

## 2023-10-04 NOTE — Patient Instructions (Addendum)
 Call to make an appointment for Annual Lung cancer screen  With CT Chest:  [phone]. Let me know if any issues in doing so. Increase ozempic  to 1mg  Stop metformin 

## 2023-10-04 NOTE — Assessment & Plan Note (Signed)
 Lost to follow up. Referred back to CT lung cancer screening program

## 2023-10-04 NOTE — Progress Notes (Signed)
 Assessment & Plan:  Diabetes mellitus without complication (HCC) Assessment & Plan: Pending A1c. Anticipate improved in setting of weightloss. Increase ozempic  to 1mg . Dc metformin .   Orders: -     Hemoglobin A1c -     CBC with Differential/Platelet -     Comprehensive metabolic panel with GFR -     Lipid panel -     PSA -     TSH -     Semaglutide  (1 MG/DOSE); Inject 1 mg as directed once a week.  Dispense: 3 mL; Refill: 3 -     B12 and Folate Panel  Lung nodule Assessment & Plan: Lost to follow up. Referred back to CT lung cancer screening program  Orders: -     Ambulatory Referral for Lung Cancer Scre  Memory changes Assessment & Plan: Subtle. Discussed b12, TSH. Pending labs. We jointly agreed not to obtain MMSE nor Brain MRI at this time. Will follow.       Return precautions given.   Risks, benefits, and alternatives of the medications and treatment plan prescribed today were discussed, and patient expressed understanding.   Education regarding symptom management and diagnosis given to patient on AVS either electronically or printed.  No follow-ups on file.  Bascom Bossier, FNP  Subjective:    Patient ID: Wayne Sanford, male    DOB: [birth year], 53 y.o.   MRN: [id number]  CC: Wayne Sanford is a 53 y.o. male who presents today for follow up.   HPI: He has tolerated the ozempic  well.  He would like to loose weight      Started Ozempic   09/04/2023. He has completed ozmepic 0.5mg    He is no longer on metformin  1000 mg daily Seen by pulmonology 03/27/2022 for 15 mm left lower nodule.  Malignancy Repeat CT chest in 6 months Former smoker  He describes being more forgetful and his wife thinks that he doesn't pay attention. He doesn't think he has hearing loss although he feels his wife is 'mumbling'.  He hasn't gotten lost nor forgotten loved ones names No depression. Sleeping well.  He was adopted.     Allergies: Patient has no known  allergies. Current Outpatient Medications on File Prior to Visit  Medication Sig Dispense Refill   fluorouracil  (EFUDEX ) 5 % cream Apply topically 2 (two) times daily. Bid to temples, cheeks, and scalp for 4 to 7 days, then wait a month and repeat 30 g 3   No current facility-administered medications on file prior to visit.    Review of Systems  Constitutional:  Negative for chills and fever.  Respiratory:  Negative for cough.   Cardiovascular:  Negative for chest pain and palpitations.  Gastrointestinal:  Negative for nausea and vomiting.  Psychiatric/Behavioral:  Negative for sleep disturbance. The patient is not nervous/anxious.       Objective:    BP 118/76   Pulse 73   Temp 97.8 F (36.6 C) (Oral)   Ht 5' 11 (1.803 m)   Wt 204 lb 3.2 oz (92.6 kg)   SpO2 96%   BMI 28.48 kg/m  BP Readings from Last 3 Encounters:  10/04/23 118/76  06/20/23 120/78  02/20/23 118/80   Wt Readings from Last 3 Encounters:  10/04/23 204 lb 3.2 oz (92.6 kg)  06/20/23 213 lb 6.4 oz (96.8 kg)  02/20/23 214 lb (97.1 kg)    Physical Exam Vitals reviewed.  Constitutional:      Appearance: He is well-developed.   Cardiovascular:  Rate and Rhythm: Regular rhythm.     Heart sounds: Normal heart sounds.  Pulmonary:     Effort: Pulmonary effort is normal. No respiratory distress.     Breath sounds: Normal breath sounds. No wheezing, rhonchi or rales.   Skin:    General: Skin is warm and dry.   Neurological:     Mental Status: He is alert.   Psychiatric:        Speech: Speech normal.        Behavior: Behavior normal.

## 2023-10-04 NOTE — Assessment & Plan Note (Signed)
 Pending A1c. Anticipate improved in setting of weightloss. Increase ozempic  to 1mg . Dc metformin .

## 2023-10-04 NOTE — Assessment & Plan Note (Signed)
 Subtle. Discussed b12, TSH. Pending labs. We jointly agreed not to obtain MMSE nor Brain MRI at this time. Will follow.

## 2023-10-08 ENCOUNTER — Ambulatory Visit: Payer: Self-pay | Admitting: Family

## 2023-10-08 DIAGNOSIS — R899 Unspecified abnormal finding in specimens from other organs, systems and tissues: Secondary | ICD-10-CM

## 2023-10-10 ENCOUNTER — Telehealth: Payer: Self-pay

## 2023-10-10 ENCOUNTER — Other Ambulatory Visit (HOSPITAL_COMMUNITY): Payer: Self-pay

## 2023-10-10 NOTE — Telephone Encounter (Signed)
 Pharmacy Patient Advocate Encounter   Received notification from RX Request Messages that prior authorization for Ozempic  4mg /68ml is required/requested.   Insurance verification completed.   The patient is insured through [facility] ADVANTAGE/RX ADVANCE .   Per test claim: The current 30 day co-pay is, $0.00.  No PA needed at this time. This test claim was processed through [facility]- copay amounts may vary at other pharmacies due to pharmacy/plan contracts, or as the patient moves through the different stages of their insurance plan.

## 2023-10-10 NOTE — Telephone Encounter (Signed)
 Per test claim PA is not needed- may need to be processed for Brand Ozempic . Test claim showing $0.00

## 2023-10-10 NOTE — Telephone Encounter (Signed)
 See previous message. Not sure why this was routed back .

## 2023-12-14 ENCOUNTER — Other Ambulatory Visit: Payer: Self-pay | Admitting: Family

## 2023-12-14 DIAGNOSIS — E119 Type 2 diabetes mellitus without complications: Secondary | ICD-10-CM

## 2023-12-14 IMAGING — CT CT PELVIS W/O CM
2 of 4 series · 15 of 46 positions shown, 17 images · non-contrast
Comparison: No priors.

CLINICAL DATA: 50-year-old male with history of chronic pelvic pain
for the past 2 years. Increased urinary frequency and decreased
strength of urinary stream.

EXAM:
CT PELVIS WITHOUT CONTRAST
TECHNIQUE: Multidetector CT imaging of the pelvis was performed following the
standard protocol without intravenous contrast.
RADIATION DOSE REDUCTION: This exam was performed according to the
departmental dose-optimization program which includes automated
exposure control, adjustment of the mA and/or kV according to
patient size and/or use of iterative reconstruction technique.

[Series 3: axials routine abdomen pelvis without 5.00 · axial · non-contrast · 0.91mm/px · z∈[-1628,-1398]mm · 12 of 54 slices shown, 14 images]
[im 4/54  soft-tissue]
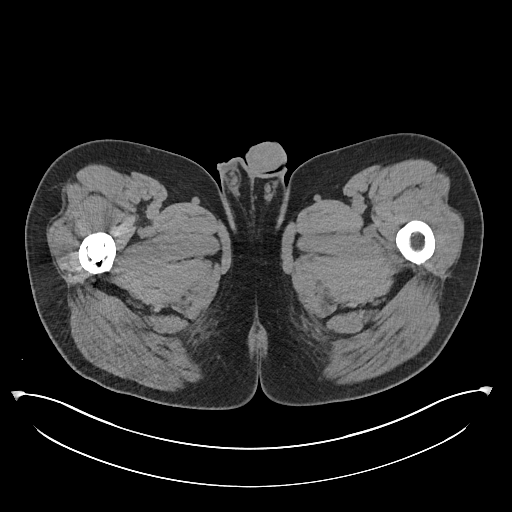
[im 4/54  bone]
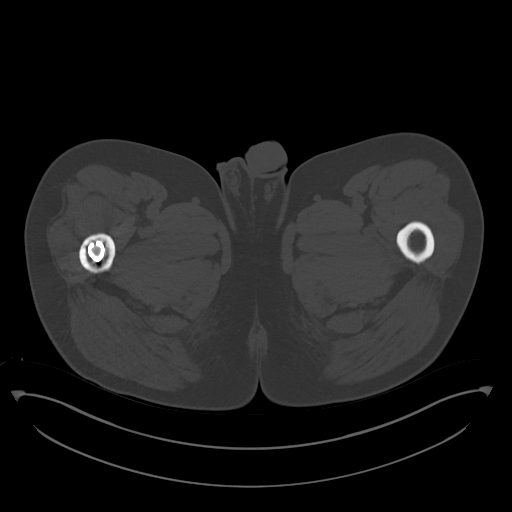
[im 8/54  soft-tissue]
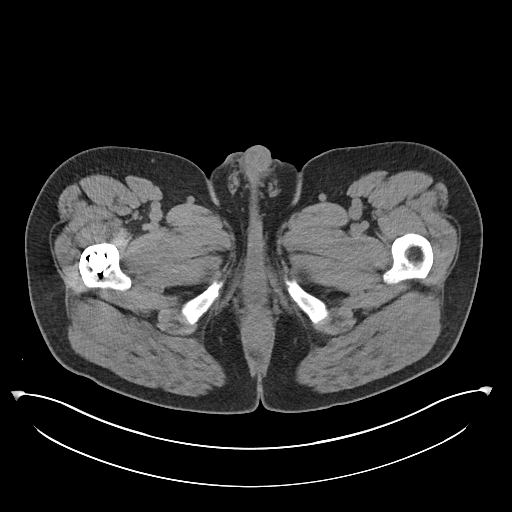
[im 11/54  soft-tissue]
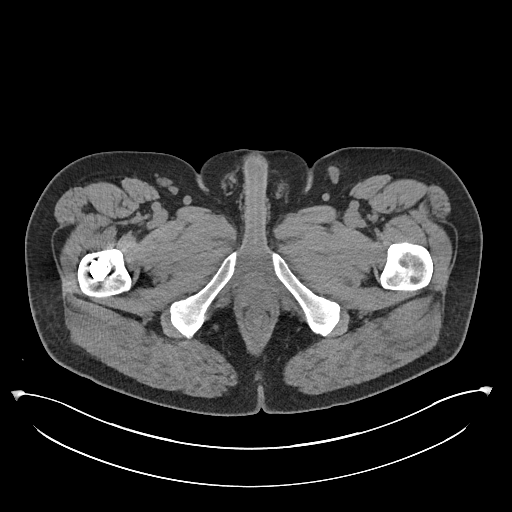
[im 18/54  soft-tissue]
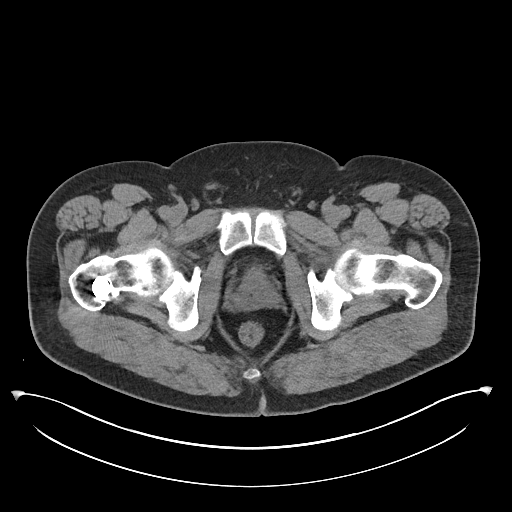
[im 22/54  soft-tissue]
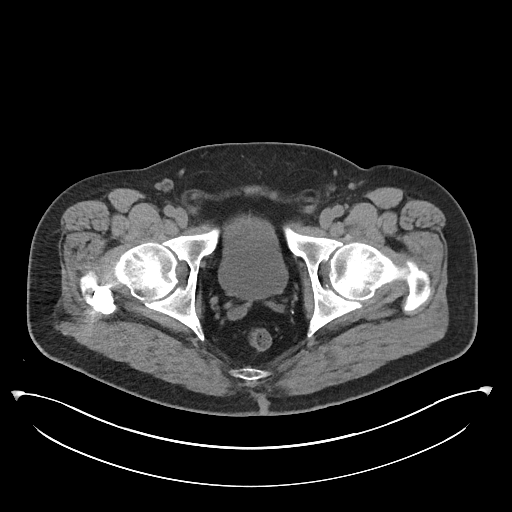
[im 25/54  soft-tissue]
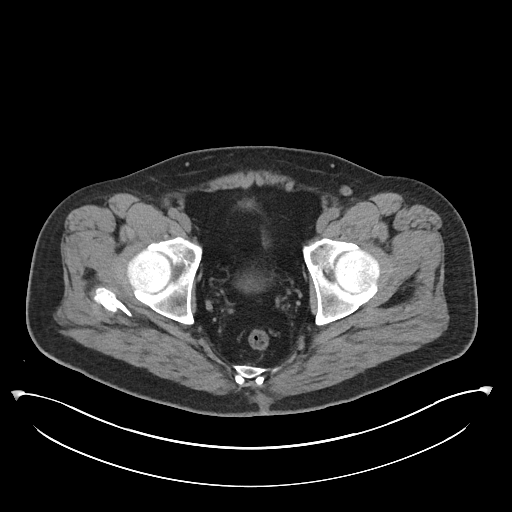
[im 29/54  soft-tissue]
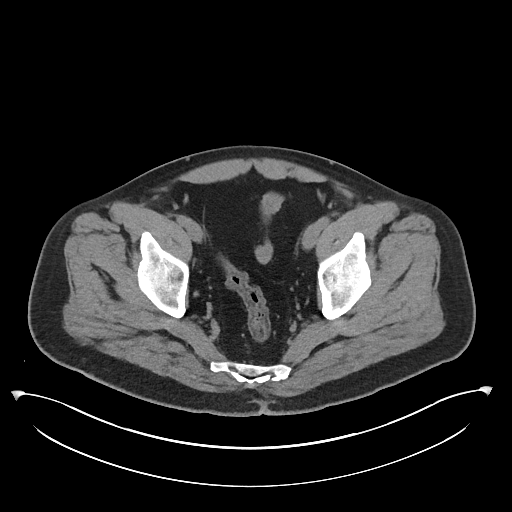
[im 32/54  soft-tissue]
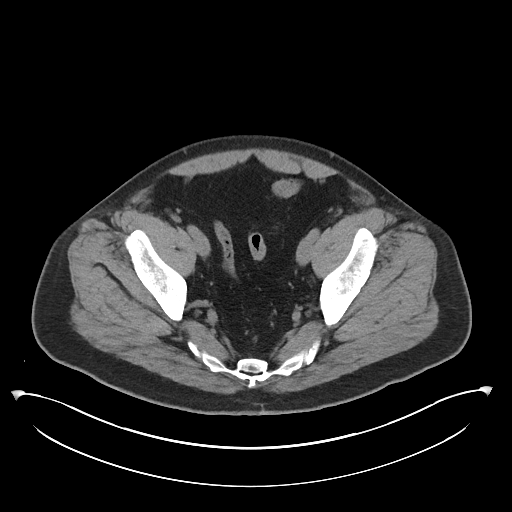
[im 36/54  soft-tissue]
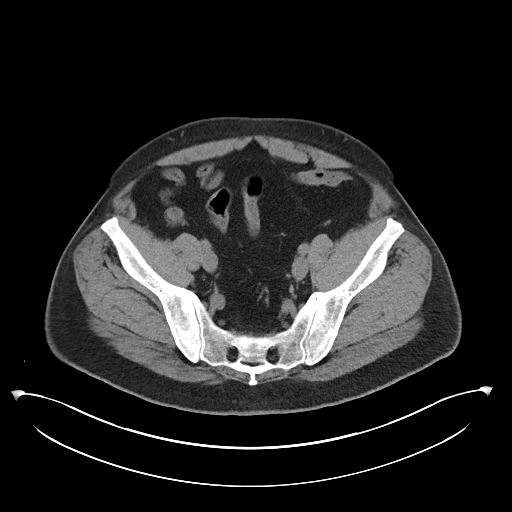
[im 36/54  bone]
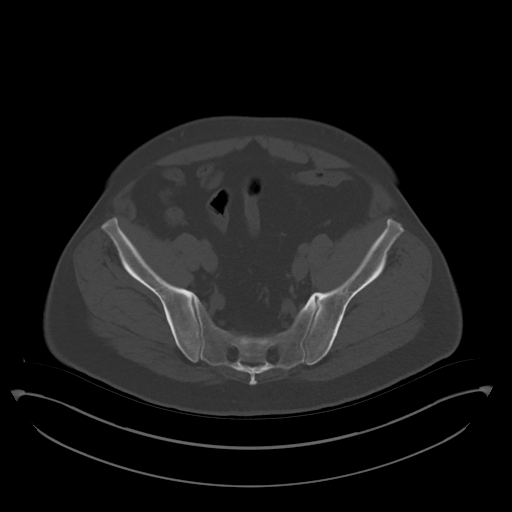
[im 43/54  soft-tissue]
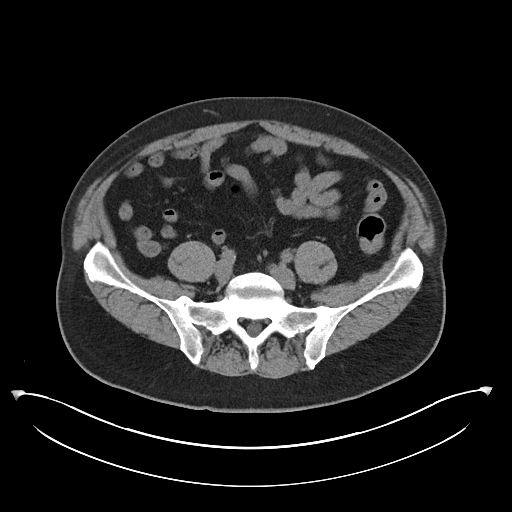
[im 46/54  soft-tissue]
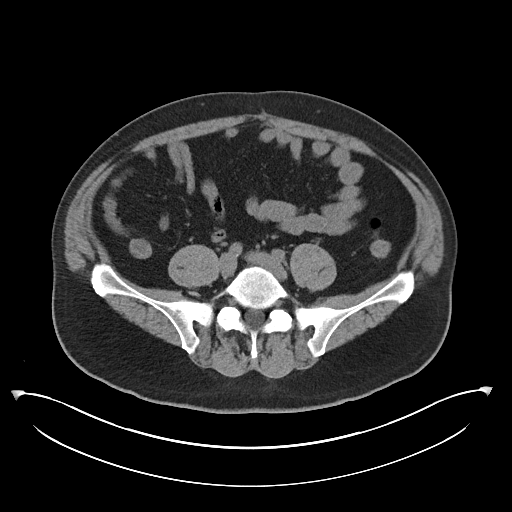
[im 50/54  soft-tissue]
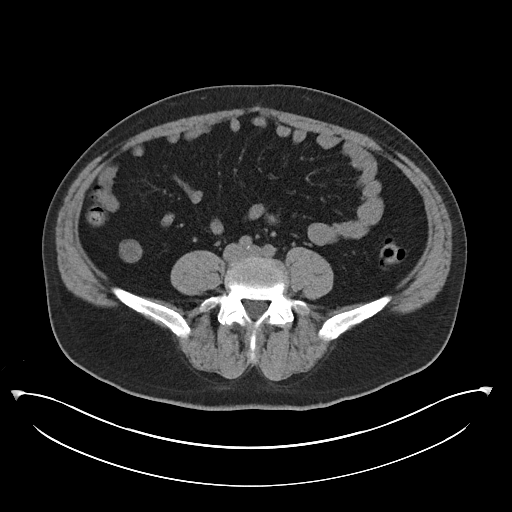

[Series 5: coronals routine abdomen pelvis without 2.00 cor · coronal · non-contrast · 0.54mm/px · 3 of 154 slices shown]
[im 52/154  soft-tissue]
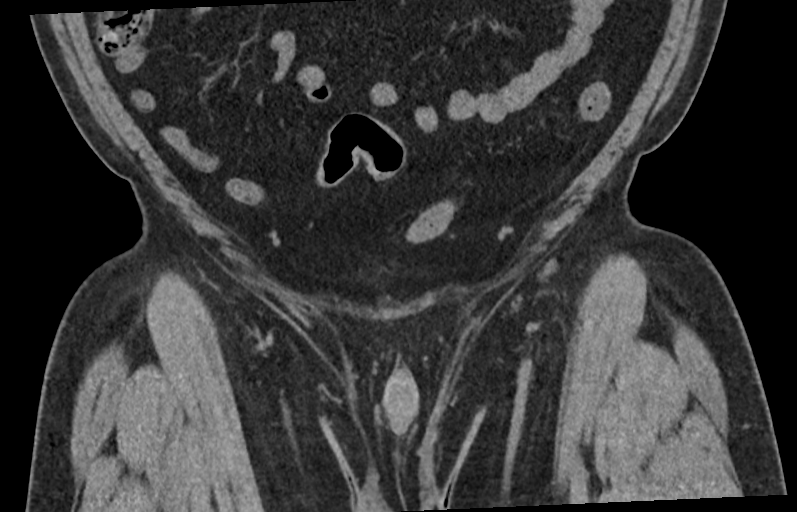
[im 69/154  soft-tissue]
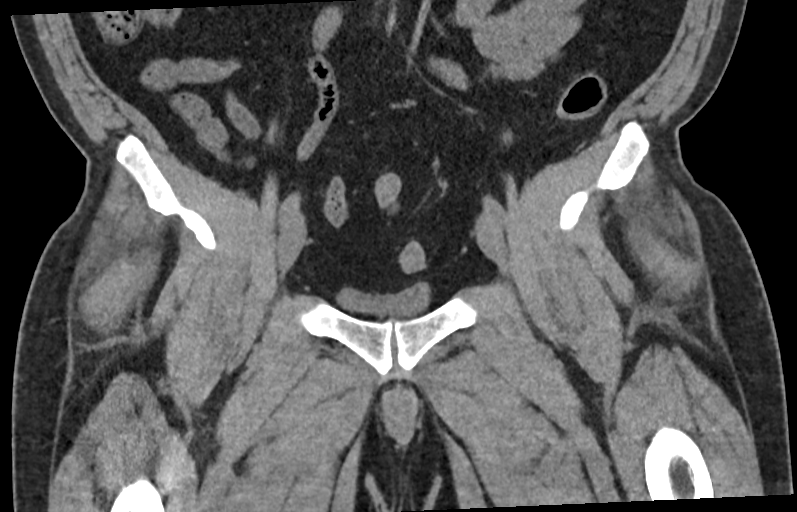
[im 86/154  soft-tissue]
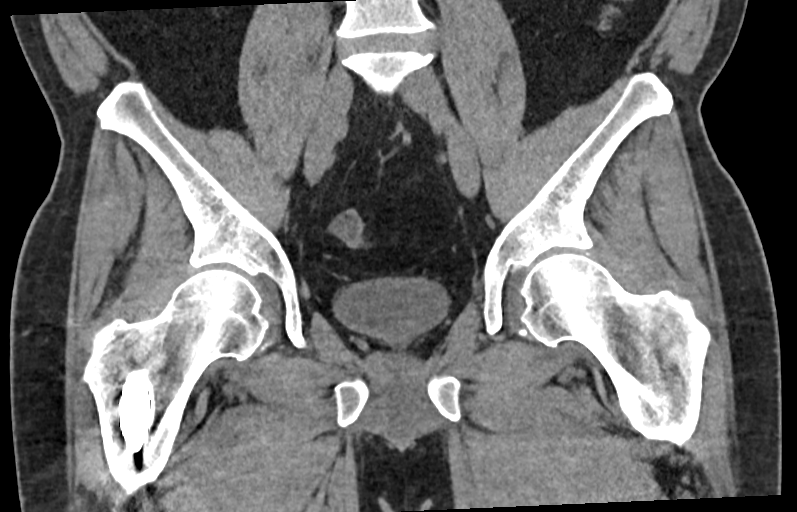

[15 of 46 positions shown; findings below may reference images not displayed]

FINDINGS: Urinary Tract: Distal ureters and urinary bladder are unremarkable
in appearance.

Bowel: Visualized portions of small bowel, colon and rectum are
unremarkable in appearance.

Vascular/Lymphatic: Atherosclerotic calcifications are noted in the
pelvic vasculature. No lymphadenopathy noted in the pelvis.

Reproductive: Prostate gland and seminal vesicles are grossly
unremarkable in appearance.

Other: No significant volume of ascites and no pneumoperitoneum
noted in the visualized portions of the peritoneal cavity.

Musculoskeletal: Intramedullary fixation rod in the proximal aspect
of the right femur incompletely imaged. There are no aggressive
appearing lytic or blastic lesions noted in the visualized portions
of the skeleton.
IMPRESSION: 1. No findings in the pelvis to account for the patient's history of
chronic pelvic pain.
2. Atherosclerosis.

## 2023-12-20 ENCOUNTER — Encounter: Payer: Self-pay | Admitting: Family

## 2024-02-27 ENCOUNTER — Ambulatory Visit: Admitting: Dermatology

## 2024-03-05 ENCOUNTER — Encounter: Payer: Self-pay | Admitting: Family

## 2024-03-05 ENCOUNTER — Ambulatory Visit (INDEPENDENT_AMBULATORY_CARE_PROVIDER_SITE_OTHER): Admitting: Family

## 2024-03-05 VITALS — BP 118/62 | HR 67 | Temp 98.2°F | Ht 71.0 in | Wt 201.8 lb

## 2024-03-05 DIAGNOSIS — R17 Unspecified jaundice: Secondary | ICD-10-CM

## 2024-03-05 DIAGNOSIS — E119 Type 2 diabetes mellitus without complications: Secondary | ICD-10-CM | POA: Diagnosis not present

## 2024-03-05 DIAGNOSIS — R7309 Other abnormal glucose: Secondary | ICD-10-CM

## 2024-03-05 DIAGNOSIS — R972 Elevated prostate specific antigen [PSA]: Secondary | ICD-10-CM | POA: Diagnosis not present

## 2024-03-05 LAB — COMPREHENSIVE METABOLIC PANEL WITH GFR
ALT: 15 U/L (ref 0–53)
AST: 16 U/L (ref 0–37)
Albumin: 4.2 g/dL (ref 3.5–5.2)
Alkaline Phosphatase: 74 U/L (ref 39–117)
BUN: 12 mg/dL (ref 6–23)
CO2: 29 meq/L (ref 19–32)
Calcium: 9.3 mg/dL (ref 8.4–10.5)
Chloride: 104 meq/L (ref 96–112)
Creatinine, Ser: 1.13 mg/dL (ref 0.40–1.50)
GFR: 73.96 mL/min (ref >60.00)
Glucose, Bld: 151 mg/dL — ABNORMAL HIGH (ref 70–99)
Potassium: 4.1 meq/L (ref 3.5–5.1)
Sodium: 139 meq/L (ref 135–145)
Total Bilirubin: 0.7 mg/dL (ref 0.2–1.2)
Total Protein: 6.8 g/dL (ref 6.0–8.3)

## 2024-03-05 LAB — POCT GLYCOSYLATED HEMOGLOBIN (HGB A1C): HbA1c POC (<> result, manual entry): 5.8 % (ref 4.0–5.6)

## 2024-03-05 LAB — PSA: PSA: 1.08 ng/mL (ref 0.10–4.00)

## 2024-03-05 MED ORDER — OZEMPIC (0.25 OR 0.5 MG/DOSE) 2 MG/3ML ~~LOC~~ SOPN: 0.5000 mg | PEN_INJECTOR | SUBCUTANEOUS | 2 refills | Status: AC

## 2024-03-05 NOTE — Assessment & Plan Note (Signed)
 Excellent control.  Advised patient to decrease Ozempic  from 1 mg to 0.5 mg weekly for weight maintenance, glycemic control.

## 2024-03-05 NOTE — Progress Notes (Signed)
   Assessment & Plan:  Elevated glucose -     POCT glycosylated hemoglobin (Hb A1C)  Diabetes mellitus without complication (HCC) Assessment & Plan: Excellent control.  Advised patient to decrease Ozempic  from 1 mg to 0.5 mg weekly for weight maintenance, glycemic control.  Orders: -     Comprehensive metabolic panel with GFR  Elevated bilirubin -     Bilirubin, fractionated(tot/dir/indir)  Increased prostate specific antigen (PSA) velocity -     PSA  Other orders -     Ozempic  (0.25 or 0.5 MG/DOSE); Inject 0.5 mg into the skin once a week.  Dispense: 3 mL; Refill: 2     Return precautions given.   Risks, benefits, and alternatives of the medications and treatment plan prescribed today were discussed, and patient expressed understanding.   Education regarding symptom management and diagnosis given to patient on AVS either electronically or printed.  Return in about 4 months (around 07/03/2024).  Wayne Northern, FNP  Subjective:    Patient ID: Wayne Sanford, male    DOB: Mar 15, 1971, 53 y.o.   MRN: [id number]  CC: Wayne Sanford is a 53 y.o. male who presents today for follow up.   HPI: Feels well on ozempic  . He is taking ozempic  1mg  every other week as he didn't want to loose more weight. He would like to maintain weight.   Denies nausea, constipation  He endorses eating less portion sizes.   Denies urinary hesitancy, decreased urine stream    Allergies: Patient has no known allergies. Current Outpatient Medications on File Prior to Visit  Medication Sig Dispense Refill   fluorouracil  (EFUDEX ) 5 % cream Apply topically 2 (two) times daily. Bid to temples, cheeks, and scalp for 4 to 7 days, then wait a month and repeat 30 g 3   No current facility-administered medications on file prior to visit.    Review of Systems  Constitutional:  Negative for chills and fever.  Respiratory:  Negative for cough.   Cardiovascular:  Negative for chest pain and palpitations.   Gastrointestinal:  Negative for constipation, nausea and vomiting.  Genitourinary:  Negative for decreased urine volume, difficulty urinating and urgency.      Objective:    BP 118/62   Pulse 67   Temp 98.2 F (36.8 C) (Oral)   Ht 5' 11 (1.803 m)   Wt 201 lb 12.8 oz (91.5 kg)   SpO2 97%   BMI 28.15 kg/m  BP Readings from Last 3 Encounters:  03/05/24 118/62  10/04/23 118/76  06/20/23 120/78   Wt Readings from Last 3 Encounters:  03/05/24 201 lb 12.8 oz (91.5 kg)  10/04/23 204 lb 3.2 oz (92.6 kg)  06/20/23 213 lb 6.4 oz (96.8 kg)    Physical Exam Vitals reviewed.  Constitutional:      Appearance: He is well-developed.  Cardiovascular:     Rate and Rhythm: Regular rhythm.     Heart sounds: Normal heart sounds.  Pulmonary:     Effort: Pulmonary effort is normal. No respiratory distress.     Breath sounds: Normal breath sounds. No wheezing, rhonchi or rales.  Skin:    General: Skin is warm and dry.  Neurological:     Mental Status: He is alert.  Psychiatric:        Speech: Speech normal.        Behavior: Behavior normal.

## 2024-03-06 ENCOUNTER — Encounter: Payer: Self-pay | Admitting: Family

## 2024-03-06 ENCOUNTER — Ambulatory Visit: Payer: Self-pay | Admitting: Family

## 2024-03-06 LAB — BILIRUBIN, FRACTIONATED(TOT/DIR/INDIR)
Bilirubin, Direct: 0.1 mg/dL (ref 0.0–0.2)
Indirect Bilirubin: 0.5 mg/dL (ref 0.2–1.2)
Total Bilirubin: 0.6 mg/dL (ref 0.2–1.2)

## 2024-04-27 ENCOUNTER — Other Ambulatory Visit: Payer: Self-pay | Admitting: Family

## 2024-04-27 DIAGNOSIS — E119 Type 2 diabetes mellitus without complications: Secondary | ICD-10-CM

## 2024-07-04 ENCOUNTER — Ambulatory Visit: Admitting: Family
